# Patient Record
Sex: Female | Born: 1945
Health system: Southern US, Community
[De-identification: ages and names within clinical notes are randomized; demographics above are authoritative.]

## PROBLEM LIST (undated history)

## (undated) DIAGNOSIS — C50919 Malignant neoplasm of unspecified site of unspecified female breast: Secondary | ICD-10-CM

## (undated) DIAGNOSIS — I10 Essential (primary) hypertension: Secondary | ICD-10-CM

## (undated) DIAGNOSIS — Z923 Personal history of irradiation: Secondary | ICD-10-CM

## (undated) DIAGNOSIS — C801 Malignant (primary) neoplasm, unspecified: Secondary | ICD-10-CM

## (undated) DIAGNOSIS — F419 Anxiety disorder, unspecified: Secondary | ICD-10-CM

## (undated) HISTORY — DX: Essential (primary) hypertension: I10

## (undated) HISTORY — DX: Malignant (primary) neoplasm, unspecified: C80.1

## (undated) HISTORY — PX: TONSILLECTOMY AND ADENOIDECTOMY: SUR1326

## (undated) HISTORY — PX: MASTECTOMY: SHX3

## (undated) HISTORY — PX: COLONOSCOPY: SHX174

## (undated) HISTORY — DX: Anxiety disorder, unspecified: F41.9

---

## 1993-05-02 DIAGNOSIS — C50919 Malignant neoplasm of unspecified site of unspecified female breast: Secondary | ICD-10-CM

## 1993-05-02 HISTORY — PX: BREAST LUMPECTOMY: SHX2

## 1993-05-02 HISTORY — PX: OTHER SURGICAL HISTORY: SHX169

## 1993-05-02 HISTORY — DX: Malignant neoplasm of unspecified site of unspecified female breast: C50.919

## 1998-01-14 ENCOUNTER — Ambulatory Visit (HOSPITAL_COMMUNITY): Admission: RE | Admit: 1998-01-14 | Discharge: 1998-01-14 | Payer: Self-pay | Admitting: Gastroenterology

## 1998-05-07 ENCOUNTER — Ambulatory Visit (HOSPITAL_COMMUNITY): Admission: RE | Admit: 1998-05-07 | Discharge: 1998-05-07 | Payer: Self-pay | Admitting: Obstetrics and Gynecology

## 1999-05-18 ENCOUNTER — Other Ambulatory Visit: Admission: RE | Admit: 1999-05-18 | Discharge: 1999-05-18 | Payer: Self-pay | Admitting: Obstetrics and Gynecology

## 1999-07-08 ENCOUNTER — Encounter: Payer: Self-pay | Admitting: Obstetrics and Gynecology

## 1999-07-08 ENCOUNTER — Ambulatory Visit (HOSPITAL_COMMUNITY): Admission: RE | Admit: 1999-07-08 | Discharge: 1999-07-08 | Payer: Self-pay | Admitting: Obstetrics and Gynecology

## 2000-08-04 ENCOUNTER — Other Ambulatory Visit: Admission: RE | Admit: 2000-08-04 | Discharge: 2000-08-04 | Payer: Self-pay | Admitting: Obstetrics and Gynecology

## 2000-08-07 ENCOUNTER — Ambulatory Visit (HOSPITAL_COMMUNITY): Admission: RE | Admit: 2000-08-07 | Discharge: 2000-08-07 | Payer: Self-pay | Admitting: Obstetrics and Gynecology

## 2000-08-07 ENCOUNTER — Encounter: Payer: Self-pay | Admitting: Obstetrics and Gynecology

## 2001-10-10 ENCOUNTER — Ambulatory Visit (HOSPITAL_COMMUNITY): Admission: RE | Admit: 2001-10-10 | Discharge: 2001-10-10 | Payer: Self-pay | Admitting: Obstetrics and Gynecology

## 2001-10-10 ENCOUNTER — Encounter: Payer: Self-pay | Admitting: Obstetrics and Gynecology

## 2002-02-26 ENCOUNTER — Encounter: Admission: RE | Admit: 2002-02-26 | Discharge: 2002-02-26 | Payer: Self-pay | Admitting: Family Medicine

## 2002-02-26 ENCOUNTER — Encounter: Payer: Self-pay | Admitting: Family Medicine

## 2002-10-30 ENCOUNTER — Encounter: Payer: Self-pay | Admitting: General Surgery

## 2002-10-30 ENCOUNTER — Encounter: Admission: RE | Admit: 2002-10-30 | Discharge: 2002-10-30 | Payer: Self-pay | Admitting: General Surgery

## 2002-10-30 ENCOUNTER — Encounter (INDEPENDENT_AMBULATORY_CARE_PROVIDER_SITE_OTHER): Payer: Self-pay | Admitting: Specialist

## 2003-11-12 ENCOUNTER — Encounter: Admission: RE | Admit: 2003-11-12 | Discharge: 2003-11-12 | Payer: Self-pay | Admitting: Family Medicine

## 2004-01-29 ENCOUNTER — Ambulatory Visit (HOSPITAL_COMMUNITY): Admission: RE | Admit: 2004-01-29 | Discharge: 2004-01-29 | Payer: Self-pay | Admitting: Gastroenterology

## 2004-10-22 ENCOUNTER — Other Ambulatory Visit: Admission: RE | Admit: 2004-10-22 | Discharge: 2004-10-22 | Payer: Self-pay | Admitting: Obstetrics and Gynecology

## 2005-01-05 ENCOUNTER — Encounter: Admission: RE | Admit: 2005-01-05 | Discharge: 2005-01-05 | Payer: Self-pay | Admitting: General Surgery

## 2006-01-11 ENCOUNTER — Encounter: Admission: RE | Admit: 2006-01-11 | Discharge: 2006-01-11 | Payer: Self-pay | Admitting: Family Medicine

## 2007-01-17 ENCOUNTER — Encounter: Admission: RE | Admit: 2007-01-17 | Discharge: 2007-01-17 | Payer: Self-pay | Admitting: *Deleted

## 2008-03-18 ENCOUNTER — Encounter: Admission: RE | Admit: 2008-03-18 | Discharge: 2008-03-18 | Payer: Self-pay | Admitting: Family Medicine

## 2008-08-06 ENCOUNTER — Encounter: Payer: Self-pay | Admitting: Internal Medicine

## 2009-04-02 ENCOUNTER — Encounter: Admission: RE | Admit: 2009-04-02 | Discharge: 2009-04-02 | Payer: Self-pay | Admitting: Family Medicine

## 2009-11-17 ENCOUNTER — Ambulatory Visit: Payer: Self-pay | Admitting: Internal Medicine

## 2009-11-17 DIAGNOSIS — F411 Generalized anxiety disorder: Secondary | ICD-10-CM | POA: Insufficient documentation

## 2009-11-17 DIAGNOSIS — I10 Essential (primary) hypertension: Secondary | ICD-10-CM | POA: Insufficient documentation

## 2009-11-17 DIAGNOSIS — Z853 Personal history of malignant neoplasm of breast: Secondary | ICD-10-CM

## 2009-11-17 DIAGNOSIS — Z85828 Personal history of other malignant neoplasm of skin: Secondary | ICD-10-CM

## 2009-11-17 DIAGNOSIS — F17200 Nicotine dependence, unspecified, uncomplicated: Secondary | ICD-10-CM

## 2009-11-25 ENCOUNTER — Ambulatory Visit: Payer: Self-pay | Admitting: Internal Medicine

## 2009-11-26 LAB — CONVERTED CEMR LAB
ALT: 31 units/L (ref 0–35)
AST: 34 units/L (ref 0–37)
Albumin: 4.3 g/dL (ref 3.5–5.2)
Alkaline Phosphatase: 80 units/L (ref 39–117)
Basophils Relative: 0.3 % (ref 0.0–3.0)
Calcium: 8.7 mg/dL (ref 8.4–10.5)
Chloride: 99 meq/L (ref 96–112)
Cholesterol: 135 mg/dL (ref 0–200)
HCT: 43.1 % (ref 36.0–46.0)
Hemoglobin: 14.8 g/dL (ref 12.0–15.0)
LDL Cholesterol: 55 mg/dL (ref 0–99)
Lymphs Abs: 1.7 10*3/uL (ref 0.7–4.0)
MCV: 106.6 fL — ABNORMAL HIGH (ref 78.0–100.0)
Neutro Abs: 2.8 10*3/uL (ref 1.4–7.7)
Neutrophils Relative %: 51.5 % (ref 43.0–77.0)
Potassium: 5 meq/L (ref 3.5–5.1)
RDW: 13.4 % (ref 11.5–14.6)
Sodium: 137 meq/L (ref 135–145)
TSH: 0.59 microintl units/mL (ref 0.35–5.50)
Total CHOL/HDL Ratio: 2
Total Protein: 6.7 g/dL (ref 6.0–8.3)
Triglycerides: 10 mg/dL (ref 0.0–149.0)
VLDL: 2 mg/dL (ref 0.0–40.0)
WBC: 5.5 10*3/uL (ref 4.5–10.5)

## 2009-12-22 ENCOUNTER — Ambulatory Visit: Payer: Self-pay | Admitting: Internal Medicine

## 2009-12-25 ENCOUNTER — Telehealth (INDEPENDENT_AMBULATORY_CARE_PROVIDER_SITE_OTHER): Payer: Self-pay | Admitting: *Deleted

## 2010-02-09 ENCOUNTER — Telehealth: Payer: Self-pay | Admitting: Internal Medicine

## 2010-02-11 ENCOUNTER — Telehealth: Payer: Self-pay | Admitting: Internal Medicine

## 2010-02-12 ENCOUNTER — Telehealth: Payer: Self-pay | Admitting: Internal Medicine

## 2010-04-08 ENCOUNTER — Encounter
Admission: RE | Admit: 2010-04-08 | Discharge: 2010-04-08 | Payer: Self-pay | Source: Home / Self Care | Admitting: Internal Medicine

## 2010-04-08 LAB — HM MAMMOGRAPHY

## 2010-06-03 NOTE — Assessment & Plan Note (Signed)
Summary: NEW TO ESTABLISH AND CONCUSSION///PH///SPH   Vital Signs:  Patient profile:   65 year old female Height:      64.75 inches Weight:      119.6 pounds BMI:     20.13 Temp:     99.4 degrees F oral Pulse rate:   72 / minute Resp:     16 per minute BP sitting:   100 / 64  (left arm) Cuff size:   regular  Vitals Entered By: Shonna Chock CMA (November 17, 2009 1:19 PM)    History of Present Illness: Sherry Rice is here to establish as a  primary care patient; she is aymptomatic. BP well controlled by history, <120/80  on average.  The patient denies lightheadedness, urinary frequency, headaches, edema, rash, and fatigue.  The patient denies the following associated symptoms: chest pain, chest pressure, exercise intolerance, dyspnea, palpitations, syncope, leg edema, and pedal edema.  Compliance with medications (by patient report) has been near 100%.  The patient reports that dietary compliance has been good.    Preventive Screening-Counseling & Management  Alcohol-Tobacco     Smoking Status: current  Caffeine-Diet-Exercise     Does Patient Exercise: yes  Current Medications (verified): 1)  Benicar Hct 40-25 Mg Tabs (Olmesartan Medoxomil-Hctz) .... 1/2 By Mouth Once Daily 2)  Fluoxetine Hcl 20 Mg Caps (Fluoxetine Hcl) .... 2 By Mouth Once Daily  Allergies (verified): 1)  ! Codeine  Past History:  Past Medical History: Anxiety Breast cancer, hx of Hypertension Skin cancer, hx of, Basal Cell & Squamous Cell, Dr Emily Filbert  Past Surgical History: Lumpectomy & radiation 1995 Tonsillectomy Colonoscopy negative X 2 (last 2007), Dr Randa Evens  Family History: Father:HTN Mother: osteoarthritis, Breast Cancer, elevated chlosterol, ,HTN, MRSA in knee Siblings: 1 sister: HTN; PGM : colon cancer  Social History: Married Current Smoker: 1/2 ppd Alcohol use-yes:socially Regular exercise-yes:  daily as walking  & [employer] Smoking Status:  current Does Patient Exercise:  yes  Review  of Systems  The patient denies anorexia, fever, vision loss, decreased hearing, hoarseness, headaches, abdominal pain, melena, hematochezia, severe indigestion/heartburn, hematuria, incontinence, unusual weight change, abnormal bleeding, enlarged lymph nodes, and angioedema.         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 Resp:  Denies chest pain with inspiration, cough, coughing up blood, shortness of breath, sputum productive, and wheezing.  Physical Exam  General:  Thin but well-nourished; alert,appropriate and cooperative throughout examination Head:  Normocephalic and atraumatic without obvious abnormalities. Eyes:  No corneal or conjunctival inflammation noted.Perrla. Funduscopic exam benign, without hemorrhages, exudates or papilledema.  Ears:  External ear exam shows no significant lesions or deformities.  Otoscopic examination reveals clear canals, tympanic membranes are intact bilaterally without bulging, retraction, inflammation or discharge. Hearing is grossly normal bilaterally. Nose:  External nasal examination shows no deformity or inflammation. Nasal mucosa are pink and moist without lesions or exudates. Mouth:  Oral mucosa and oropharynx without lesions or exudates.  Teeth in good repair. Neck:  No deformities, masses, or tenderness noted. Physiologic asymmetry of thyroid Lungs:  Normal respiratory effort, chest expands symmetrically. Lungs are clear to auscultation, no crackles or wheezes. Heart:  Normal rate and regular rhythm. S1 and S2 normal without gallop, murmur, click, rub. Loud S4 vs mitral click  @ apex  Abdomen:  Bowel sounds positive,abdomen soft and non-tender without masses, organomegaly or hernias noted. Palpable aorta w/o AAA Genitalia:  to see Gyn Msk:  Slight symmetry of thoracic muscles ,R > L  Pulses:  R and L carotid,radial,dorsalis pedis and posterior tibial pulses are full and equal bilaterally Extremities:  No clubbing, cyanosis, edema, or  deformity noted with normal full range of motion of all joints.   Neurologic:  alert & oriented X3 and DTRs symmetrical and normal.   Skin:  Biopsy site L cheek bandaged Cervical Nodes:  No lymphadenopathy noted Axillary Nodes:  No palpable lymphadenopathy Psych:  memory intact for recent and remote, normally interactive, good eye contact, not anxious appearing, and not depressed appearing.     Impression & Recommendations:  Problem # 1:  ROUTINE GENERAL MEDICAL EXAM@HEALTH  CARE FACL (ICD-V70.0)  Orders: EKG w/ Interpretation (93000) T-2 View CXR (71020TC)  Problem # 2:  HYPERTENSION (ICD-401.9)  controlled Her updated medication list for this problem includes:    Benicar Hct 40-25 Mg Tabs (Olmesartan medoxomil-hctz) .Marland Kitchen... 1/2 by mouth once daily  Orders: EKG w/ Interpretation (93000)  Problem # 3:  BREAST CANCER, HX OF (ICD-V10.3)  Problem # 4:  SMOKER (ICD-305.1)  CV risks discussed  Orders: T-2 View CXR (71020TC)  Problem # 5:  SKIN CANCER, HX OF (ICD-V10.83) as per Dr Emily Filbert  Problem # 6:  ANXIETY (ICD-300.00) stable on Fluoxetine Her updated medication list for this problem includes:    Fluoxetine Hcl 20 Mg Caps (Fluoxetine hcl) .Marland Kitchen... 2 by mouth once daily  Complete Medication List: 1)  Benicar Hct 40-25 Mg Tabs (Olmesartan medoxomil-hctz) .... 1/2 by mouth once daily 2)  Fluoxetine Hcl 20 Mg Caps (Fluoxetine hcl) .... 2 by mouth once daily  Patient Instructions: 1)  Please consider fasting labs: 2)  BMP ; 3)  Hepatic Panel ; 4)  Lipid Panel ; 5)  TSH ; 6)  CBC w/ Diff . 7)  Check your Blood Pressure regularly. If it is above: 135/85 ON AVERAGE  you should make an appointment. Prescriptions: FLUOXETINE HCL 20 MG CAPS (FLUOXETINE HCL) 2 by mouth once daily  #180 x 1   Entered and Authorized by:   Marga Melnick MD   Signed by:   Marga Melnick MD on 11/17/2009   Method used:   Print then Give to Patient   RxID:   [id number] BENICAR HCT 40-25 MG  TABS (OLMESARTAN MEDOXOMIL-HCTZ) 1/2 by mouth once daily  #30 x 5   Entered and Authorized by:   Marga Melnick MD   Signed by:   Marga Melnick MD on 11/17/2009   Method used:   Print then Give to Patient   RxID:   [id number]

## 2010-06-03 NOTE — Progress Notes (Signed)
Summary: BP med alternative  Phone Note Call from Patient Call back at Work Phone [phone]   Summary of Call: Patient left message on triage that she was given Losartan and when she went to pick it up the prescription cost $200+. Patient would like cheaper alternative. Do you have an alternative that you prefer to give or should the patient contact her insurance company for preferred alternative. Please advise. Initial call taken by: Lucious Groves CMA,  February 11, 2010 10:25 AM  Follow-up for Phone Call        Per MD-- This is a generic and should be the cheapest in class. Please check with your insurance company.  I spoke with patient pharmacy and the med is $227.89 b/c she does not have insurance. I notified the patient and she has an Inclusive plan that 5k was put into, so she will take her card up to the pharmacy so the prescription can be ran again with insurance. Lucious Groves CMA  February 11, 2010 11:16 AM

## 2010-06-03 NOTE — Progress Notes (Signed)
Summary: Prior Auth--Losartan  Phone Note Refill Request Message from:  Pharmacy on February 12, 2010 7:57 AM  Refills Requested: Medication #1:  LOSARTAN POTASSIUM-HCTZ 100-25 MG TABS 1 by mouth qd.   Dosage confirmed as above?Dosage Confirmed   Supply Requested: 1 month Prior Auth from [facility] on [address]  Please call insurance per pharmacy.  Initial call taken by: Harold Barban,  February 12, 2010 7:57 AM  Follow-up for Phone Call        Called the # on patient card [phone] and was directed to call [phone]. Meds are being blocked due to pre-existing cond. and the member must call her ins co at [phone].  Patient notified and will take care of it. Follow-up by: Lucious Groves CMA,  February 15, 2010 9:14 AM

## 2010-06-03 NOTE — Progress Notes (Signed)
Summary: Benicar--change to generic   Phone Note Call from Patient Call back at Work Phone [phone] Message from:  Patient on February 09, 2010 11:33 AM  Caller: Patient Summary of Call: patient ins wont pay for benicar hct - patient wants  a gereric that is comparable to  benicar - [facility] Initial call taken by: Okey Regal [place],  February 09, 2010 11:35 AM  Follow-up for Phone Call        Please advise. Lucious Groves CMA  February 09, 2010 1:25 PM   Additional Follow-up for Phone Call Additional follow up Details #1::        Losartan HCT  100/ 25 #90 Additional Follow-up by: Marga Melnick MD,  February 09, 2010 3:24 PM    Additional Follow-up for Phone Call Additional follow up Details #2::    left message on voicemail notifying the patient that prescription was taken care of, call with questions. Lucious Groves CMA  February 09, 2010 5:03 PM   New/Updated Medications: LOSARTAN POTASSIUM-HCTZ 100-25 MG TABS (LOSARTAN POTASSIUM-HCTZ) 1 by mouth qd Prescriptions: LOSARTAN POTASSIUM-HCTZ 100-25 MG TABS (LOSARTAN POTASSIUM-HCTZ) 1 by mouth qd  #90 x 0   Entered by:   Lucious Groves CMA   Authorized by:   Marga Melnick MD   Signed by:   Lucious Groves CMA on 02/09/2010   Method used:   Electronically to        [id number] (retail)       [address]       [place], [place]  [address]       Ph: [phone]       Fax: [phone]   RxID:   [id number]

## 2010-06-03 NOTE — Progress Notes (Signed)
Summary: -refill request  Phone Note Refill Request Call back at [phone] Message from:  Pharmacy on December 25, 2009 4:13 PM  Refills Requested: Medication #1:  BENICAR HCT 40-25 MG TABS 1/2 by mouth once daily   Dosage confirmed as above?Dosage Confirmed   Supply Requested: 1 month prior auth: [id number]   Next Appointment Scheduled: none Initial call taken by: Lavell Islam,  December 25, 2009 4:13 PM  Follow-up for Phone Call        spoke with insurance co. they state that pt med are being denied due preexisting condition clause which is denying any med pt was on prior to enrolling with this policy. per insurance it will not expire  until  oct 31,2011. pt need to contact insurance to have clause removed. left message to call office...................Marland KitchenFelecia Deloach CMA  December 25, 2009 4:56 PM  PT AWARE.........Marland KitchenFelecia Deloach CMA  December 25, 2009 5:17 PM

## 2010-06-24 ENCOUNTER — Telehealth: Payer: Self-pay | Admitting: Internal Medicine

## 2010-06-29 NOTE — Progress Notes (Signed)
Summary: Med refill  Phone Note Refill Request   Refills Requested: Medication #1:  FLUOXETINE HCL 20 MG CAPS 2 by mouth once daily The office received refill about the above med from pharmacy in [place]. Refill was provided via phone with notation that office visit is due in July.  Initial call taken by: Lucious Groves CMA,  June 24, 2010 9:44 AM    Prescriptions: FLUOXETINE HCL 20 MG CAPS (FLUOXETINE HCL) 2 by mouth once daily  #180 x 1   Entered by:   Lucious Groves CMA   Authorized by:   Marga Melnick MD   Signed by:   Lucious Groves CMA on 06/24/2010   Method used:   Telephoned to ...       [facility] [id number]* (retail)       [address]       [place], [place]  [address]       Ph: [phone]       Fax: [phone]   RxID:   [id number]

## 2010-08-09 ENCOUNTER — Other Ambulatory Visit: Payer: Self-pay | Admitting: *Deleted

## 2010-08-09 MED ORDER — LOSARTAN POTASSIUM-HCTZ 100-25 MG PO TABS: 1.0000 | ORAL_TABLET | Freq: Every day | ORAL | Status: DC

## 2010-09-17 NOTE — Op Note (Signed)
NAME:  Sherry Rice, Sherry Rice               ACCOUNT NO.:  [id number]   MEDICAL RECORD NO.:  [id number]          PATIENT TYPE:  AMB   LOCATION:  ENDO                         FACILITY:  [facility]   PHYSICIAN:  James L. Malon Kindle., M.D.DATE OF BIRTH:  29-Mar-1946   DATE OF PROCEDURE:  DATE OF DISCHARGE:                                 OPERATIVE REPORT   PROCEDURE:  Colonoscopy.   MEDICATIONS:  Fentanyl 87.5 mcg, Versed 9 mg IV.   INSTRUMENT USED:  Olympus pediatric colonoscope.   INDICATIONS FOR PROCEDURE:  Family history of colon cancer.   DESCRIPTION OF PROCEDURE:  After the procedure had been explained to the  patient, consent was obtained.  In the left lateral decubitus position, the  scope was inserted and advanced.  The prep was quite good.  The scope was  readily advanced to the cecum.  The ileocecal valve and appendiceal orifice  were seen.  The scope was withdrawn.  The cecum, ascending colon, transverse  colon, descending, and sigmoid colon were seen well.  No polyps were seen.  There were internal hemorrhoids seen in the rectum.  The scope was  withdrawn.  The patient tolerated the procedure well.   ASSESSMENT:  1.  Family history of colon cancer with negative colonoscopy (B16.0).  2.  Internal hemorrhoids (455.0).   PLAN:  Will recommend Metamucil, yearly Hemoccults, and repeat colonoscopy  in about five years.      JLE/MEDQ  D:  01/29/2004  T:  01/29/2004  Job:  [id number]   cc:   Dellis Anes. Idell Pickles, M.D.  [address]  [place]  [place] [address]  Fax: [phone]

## 2010-11-08 ENCOUNTER — Other Ambulatory Visit: Payer: Self-pay | Admitting: Internal Medicine

## 2010-12-24 ENCOUNTER — Other Ambulatory Visit: Payer: Self-pay | Admitting: Internal Medicine

## 2011-01-04 ENCOUNTER — Encounter: Payer: Self-pay | Admitting: Internal Medicine

## 2011-01-05 ENCOUNTER — Ambulatory Visit (INDEPENDENT_AMBULATORY_CARE_PROVIDER_SITE_OTHER): Payer: PRIVATE HEALTH INSURANCE | Admitting: Internal Medicine

## 2011-01-05 ENCOUNTER — Encounter: Payer: Self-pay | Admitting: Internal Medicine

## 2011-01-05 VITALS — BP 118/80 | HR 86 | Temp 98.6°F | Resp 12 | Ht 64.75 in | Wt 125.6 lb

## 2011-01-05 DIAGNOSIS — R42 Dizziness and giddiness: Secondary | ICD-10-CM

## 2011-01-05 DIAGNOSIS — Z853 Personal history of malignant neoplasm of breast: Secondary | ICD-10-CM

## 2011-01-05 DIAGNOSIS — Z85828 Personal history of other malignant neoplasm of skin: Secondary | ICD-10-CM

## 2011-01-05 DIAGNOSIS — Z0000 Encounter for general adult medical examination without abnormal findings: Secondary | ICD-10-CM

## 2011-01-05 DIAGNOSIS — I10 Essential (primary) hypertension: Secondary | ICD-10-CM

## 2011-01-05 LAB — POCT URINALYSIS DIPSTICK
Glucose, UA: NEGATIVE
Nitrite, UA: NEGATIVE
Protein, UA: NEGATIVE
Spec Grav, UA: 1.01
pH, UA: 6

## 2011-01-05 MED ORDER — LOSARTAN POTASSIUM-HCTZ 100-25 MG PO TABS: 0.5000 | ORAL_TABLET | ORAL | Status: DC

## 2011-01-05 NOTE — Progress Notes (Signed)
Subjective:    Patient ID: Sherry Rice, female    DOB: August 30, 1945, 65 y.o.   MRN: [id number]  HPI  Sherry Rice  is here for a physical;acute issues intermittent dizziness. She had a concussion in May of 2011; she had dizziness intermittently for 6 months thereafter. The concussion was evaluated in [place] with a CAT scan.      Review of Systems Dizziness: Onset:see above; exacerbation in past 3 weeks w/o trigger Context: Position change:yes, especially arising from bed @ night; she had one episode this weekend while standing and cheering ballgame. She's had single episode while reclining for hair treatment. Benign positional vertigo symptoms:occasionally Straining:no Pain:no Cardiac prodrome: no palpitations, irregular rhythm, heart rate change Neurologic prodrome: no  headache, numbness and tingling, weakness, change in coordination (gait/falling) Syncope:no Seizure activity:no Upper respiratory tract infection/extrinsic symptoms:no Duration:lasts 1 minute Frequency:daily Associated signs and symptoms: Visual change (blurred/double/loss):no Hearing loss/tinnitus:no Nausea/sweating:no Chest pain:no Treatment/response:none    Patient reports no vision/ hearing  changes, adenopathy,fever, weight change,  persistant / recurrent hoarseness , swallowing issues,edema, hemoptysis, dyspnea( rest/ exertional/paroxysmal nocturnal), gastrointestinal bleeding(melena, rectal bleeding), abdominal pain, significant heartburn,  bowel changes,GU symptoms(dysuria, hematuria,pyuria ), Gyn symptoms(abnormal  bleeding , pain),   memory loss,  skin/hair /nail changes,abnormal bruising or bleeding, anxiety,or depression.      Objective:   Physical Exam Gen.:thin but  well-nourished in appearance. Alert, appropriate and cooperative throughout exam. Head: Normocephalic without obvious abnormalities Eyes: No corneal or conjunctival inflammation noted. Pupils equal round reactive to light and accommodation.  Fundal exam is benign without hemorrhages, exudate, papilledema. Extraocular motion intact. Vision grossly normal with lenses. FOV WNL. No nystagmus Ears: External  ear exam reveals no significant lesions or deformities. Canals: wax on R . L TM normal. Hearing is grossly normal bilaterally. Tuning fork exam normal Nose: External nasal exam reveals no deformity or inflammation. Nasal mucosa are pink and moist. No lesions or exudates noted.  Mouth: Oral mucosa and oropharynx reveal no lesions or exudates. Teeth in good repair. Neck: No deformities, masses, or tenderness noted. Range of motion & . Thyroid normal. Cervical rib suggested on L Lungs: Normal respiratory effort; chest expands symmetrically. Lungs are clear to auscultation without rales, wheezes, or increased work of breathing. Heart:S4,  slightly irregular rhythm due extra beats. No murmur. Abdomen: Bowel sounds normal; abdomen soft and nontender. No masses, organomegaly or hernias noted.aorta palpable Genitalia: seeing Gyn   .                                                                                   Musculoskeletal/extremities: No deformity or scoliosis noted of  the thoracic or lumbar spine. No clubbing, cyanosis, edema noted. Range of motion  normal .Tone & strength  Normal.Joints: DIP OA changes. Nail health  good. Vascular: Carotid, radial artery, dorsalis pedis and  posterior tibial pulses are full and equal. No bruits present. Neurologic: Alert and oriented x3. Deep tendon reflexes symmetrical and normal. Cranial nerve exam is normal. Gait is normal as well. Romberg testing and finger to nose exam was also negative.          Skin: Intact without suspicious lesions or rashes. Lymph: No cervical,  axillary  lymphadenopathy present. Psych: Mood and affect are normal. Normally interactive                                                                                        Assessment & Plan:  #1 comprehensive physical exam;  no acute findings #2 see Problem List with Assessments & Recommendations  #3 dizziness; the main process appears to be postural hypotension with episodes upon arising at night. She's had isolated episodes of possible benign positional vertigo and possibly vertebrobasilar insufficiency.  #4 frequent PVCs on EKG with intermittent bigeminy. I doubt a cardiac dysrhythmia as  etiology of her dizziness.  #5 current smoker; cardiac and pulmonary risks discussed. Plan: see Orders

## 2011-01-05 NOTE — Patient Instructions (Addendum)
Repeat the isometric exercises discussed 4- 5 times prior to standing if you've been seated or lying  for a period of time. Go to Web M.D. for information on benign positional vertigo (BPV) . Physical therapy exercises can treat that. As stated he may have a component of vertebrobasilar insufficiency; the episode the pancreas to suggest that. The major problem appears to be  Positional  Hypotension. Please think about quitting smoking.  This is very important for your health.  Consider setting a quit date, then cutting back or switching brands to prepare to stop.  Also think of the money you will save every day by not smoking. Please think about quitting smoking. Review the risks we discussed. Please call 1-800-QUIT-NOW (802) 052-1344) for free smoking cessation counseling Blood Pressure Goal  Ideally is an AVERAGE < 135/85. This AVERAGE should be calculated from @ least 5-7 BP readings taken @ different times of day on different days of week. You should not respond to isolated BP readings , but rather the AVERAGE for that week Monitor BP on 1/2 of Losartan /HCT. To prevent  Premature ventricular  beats, avoid stimulants such as decongestants, diet pills, nicotine, or caffeine (coffee, tea, cola, or chocolate) to excess.  Please  schedule fasting Labs : BMET,Lipids, hepatic panel, CBC & dif, TSH (V70.0)

## 2011-01-06 ENCOUNTER — Other Ambulatory Visit (INDEPENDENT_AMBULATORY_CARE_PROVIDER_SITE_OTHER): Payer: PRIVATE HEALTH INSURANCE

## 2011-01-06 DIAGNOSIS — Z0000 Encounter for general adult medical examination without abnormal findings: Secondary | ICD-10-CM

## 2011-01-06 LAB — LIPID PANEL
Cholesterol: 123 mg/dL (ref 0–200)
LDL Cholesterol: 39 mg/dL (ref 0–99)
Triglycerides: 37 mg/dL (ref 0.0–149.0)

## 2011-01-06 LAB — CBC WITH DIFFERENTIAL/PLATELET
Basophils Absolute: 0 10*3/uL (ref 0.0–0.1)
HCT: 43 % (ref 36.0–46.0)
Hemoglobin: 14.5 g/dL (ref 12.0–15.0)
Lymphocytes Relative: 42.3 % (ref 12.0–46.0)
MCHC: 33.7 g/dL (ref 30.0–36.0)
Platelets: 199 10*3/uL (ref 150.0–400.0)

## 2011-01-06 LAB — BASIC METABOLIC PANEL
Chloride: 102 mEq/L (ref 96–112)
GFR: 123.8 mL/min (ref >60.00)
Potassium: 4.4 mEq/L (ref 3.5–5.1)
Sodium: 141 mEq/L (ref 135–145)

## 2011-01-06 LAB — HEPATIC FUNCTION PANEL
AST: 41 U/L — ABNORMAL HIGH (ref 0–37)
Albumin: 4.4 g/dL (ref 3.5–5.2)
Alkaline Phosphatase: 59 U/L (ref 39–117)
Bilirubin, Direct: 0.2 mg/dL (ref 0.0–0.3)
Total Bilirubin: 0.8 mg/dL (ref 0.3–1.2)

## 2011-01-06 NOTE — Progress Notes (Signed)
Labs only

## 2011-01-10 ENCOUNTER — Other Ambulatory Visit: Payer: Self-pay | Admitting: Internal Medicine

## 2011-03-22 ENCOUNTER — Other Ambulatory Visit: Payer: Self-pay | Admitting: Internal Medicine

## 2011-03-30 ENCOUNTER — Other Ambulatory Visit: Payer: Self-pay | Admitting: Internal Medicine

## 2011-03-30 DIAGNOSIS — Z853 Personal history of malignant neoplasm of breast: Secondary | ICD-10-CM

## 2011-03-30 DIAGNOSIS — Z1231 Encounter for screening mammogram for malignant neoplasm of breast: Secondary | ICD-10-CM

## 2011-04-11 ENCOUNTER — Ambulatory Visit
Admission: RE | Admit: 2011-04-11 | Discharge: 2011-04-11 | Disposition: A | Discharge status: Home / Self Care | Payer: PRIVATE HEALTH INSURANCE | Source: Ambulatory Visit | Attending: Internal Medicine | Admitting: Internal Medicine

## 2011-04-11 DIAGNOSIS — Z1231 Encounter for screening mammogram for malignant neoplasm of breast: Secondary | ICD-10-CM

## 2011-04-11 DIAGNOSIS — Z853 Personal history of malignant neoplasm of breast: Secondary | ICD-10-CM

## 2011-04-13 ENCOUNTER — Other Ambulatory Visit: Payer: Self-pay | Admitting: Internal Medicine

## 2011-04-13 DIAGNOSIS — R928 Other abnormal and inconclusive findings on diagnostic imaging of breast: Secondary | ICD-10-CM

## 2011-04-15 ENCOUNTER — Ambulatory Visit
Admission: RE | Admit: 2011-04-15 | Discharge: 2011-04-15 | Disposition: A | Discharge status: Home / Self Care | Payer: PRIVATE HEALTH INSURANCE | Source: Ambulatory Visit | Attending: Internal Medicine | Admitting: Internal Medicine

## 2011-04-15 DIAGNOSIS — R928 Other abnormal and inconclusive findings on diagnostic imaging of breast: Secondary | ICD-10-CM

## 2011-07-06 ENCOUNTER — Other Ambulatory Visit: Payer: Self-pay | Admitting: Internal Medicine

## 2011-07-06 NOTE — Telephone Encounter (Signed)
Prescription sent to pharmacy.

## 2012-01-03 ENCOUNTER — Other Ambulatory Visit: Payer: Self-pay | Admitting: Internal Medicine

## 2012-01-03 NOTE — Telephone Encounter (Signed)
Patient needs to schedule a CPX  

## 2012-03-13 ENCOUNTER — Other Ambulatory Visit: Payer: Self-pay | Admitting: Internal Medicine

## 2012-03-13 DIAGNOSIS — Z853 Personal history of malignant neoplasm of breast: Secondary | ICD-10-CM

## 2012-03-13 DIAGNOSIS — Z1231 Encounter for screening mammogram for malignant neoplasm of breast: Secondary | ICD-10-CM

## 2012-04-11 ENCOUNTER — Ambulatory Visit
Admission: RE | Admit: 2012-04-11 | Discharge: 2012-04-11 | Disposition: A | Discharge status: Home / Self Care | Payer: PRIVATE HEALTH INSURANCE | Source: Ambulatory Visit | Attending: Internal Medicine | Admitting: Internal Medicine

## 2012-04-11 DIAGNOSIS — Z853 Personal history of malignant neoplasm of breast: Secondary | ICD-10-CM

## 2012-04-11 DIAGNOSIS — Z1231 Encounter for screening mammogram for malignant neoplasm of breast: Secondary | ICD-10-CM

## 2012-04-12 ENCOUNTER — Encounter: Payer: Self-pay | Admitting: Internal Medicine

## 2012-04-12 ENCOUNTER — Ambulatory Visit (INDEPENDENT_AMBULATORY_CARE_PROVIDER_SITE_OTHER): Payer: PRIVATE HEALTH INSURANCE | Admitting: Internal Medicine

## 2012-04-12 VITALS — BP 112/70 | HR 76 | Temp 98.6°F | Resp 12 | Ht 64.08 in | Wt 126.6 lb

## 2012-04-12 DIAGNOSIS — H8110 Benign paroxysmal vertigo, unspecified ear: Secondary | ICD-10-CM

## 2012-04-12 DIAGNOSIS — R9431 Abnormal electrocardiogram [ECG] [EKG]: Secondary | ICD-10-CM

## 2012-04-12 DIAGNOSIS — I10 Essential (primary) hypertension: Secondary | ICD-10-CM

## 2012-04-12 DIAGNOSIS — Z0000 Encounter for general adult medical examination without abnormal findings: Secondary | ICD-10-CM

## 2012-04-12 LAB — HEPATIC FUNCTION PANEL
AST: 26 U/L (ref 0–37)
Albumin: 4.7 g/dL (ref 3.5–5.2)
Alkaline Phosphatase: 67 U/L (ref 39–117)
Bilirubin, Direct: 0.2 mg/dL (ref 0.0–0.3)

## 2012-04-12 LAB — BASIC METABOLIC PANEL
CO2: 28 mEq/L (ref 19–32)
GFR: 113.37 mL/min (ref >60.00)
Glucose, Bld: 100 mg/dL — ABNORMAL HIGH (ref 70–99)
Potassium: 3.7 mEq/L (ref 3.5–5.1)
Sodium: 134 mEq/L — ABNORMAL LOW (ref 135–145)

## 2012-04-12 LAB — CBC WITH DIFFERENTIAL/PLATELET
Basophils Absolute: 0 10*3/uL (ref 0.0–0.1)
Eosinophils Relative: 2 % (ref 0.0–5.0)
Monocytes Absolute: 0.6 10*3/uL (ref 0.1–1.0)
Monocytes Relative: 8.5 % (ref 3.0–12.0)
Neutrophils Relative %: 43.8 % (ref 43.0–77.0)
Platelets: 230 10*3/uL (ref 150.0–400.0)
WBC: 6.9 10*3/uL (ref 4.5–10.5)

## 2012-04-12 LAB — LIPID PANEL
LDL Cholesterol: 41 mg/dL (ref 0–99)
Total CHOL/HDL Ratio: 2
VLDL: 5 mg/dL (ref 0.0–40.0)

## 2012-04-12 LAB — TSH: TSH: 0.48 u[IU]/mL (ref 0.35–5.50)

## 2012-04-12 MED ORDER — LOSARTAN POTASSIUM-HCTZ 100-25 MG PO TABS: 1.0000 | ORAL_TABLET | ORAL | Status: DC

## 2012-04-12 NOTE — Patient Instructions (Addendum)
Preventive Health Care: Exercise  30-45  minutes a day, 3-4 days a week. Walking is especially valuable in preventing Osteoporosis. Eat a low-fat diet with lots of fruits and vegetables, up to 7-9 servings per day.  Consume less than 30 grams of sugar per day from foods & drinks with High Fructose Corn Syrup as #1,2,3 or #4 on label. Blood Pressure Goal  Ideally is an AVERAGE < 135/85. This AVERAGE should be calculated from @ least 5-7 BP readings taken @ different times of day on different days of week. You should not respond to isolated BP readings , but rather the AVERAGE for that week . Take the EKG to any emergency room or preop visits. There are nonspecific changes; as long as there is no new change these are not clinically significant Please think about quitting smoking. Review the risks we discussed. Please call 1-800-QUIT-NOW (458-102-9492) for free smoking cessation counseling.  Health Care Power of Attorney & Living Will place you in charge of your health care  decisions. Verify these are  in place. Go to Web M.D. for information on benign positional vertigo (BPV) . Physical therapy exercises can treat that.  If you activate My Chart; the results can be released to you as soon as they populate from the lab. If you choose not to use this program; the labs have to be reviewed, copied & mailed   causing a delay in getting the results to you.

## 2012-04-12 NOTE — Progress Notes (Signed)
  Subjective:    Patient ID: Sherry Rice, female    DOB: [birth year], 66 y.o.   MRN: [id number]  HPI  [name]  is here for a physical;acute issues include intermittent BPV      Review of Systems The benign positional vertigo flared approximately 2 weeks ago without trigger. It occurs in the right lateral decubitus position or when she tries to sit up in bed. She's never seen physical therapy; but her son-in-law is a physical therapist.     Objective:   Physical Exam Gen.: Thin but healthy and well-nourished in appearance. Alert, appropriate and cooperative throughout exam. Head: Normocephalic without obvious abnormalities Eyes: No corneal or conjunctival inflammation noted. Pupils equal round reactive to light and accommodation. Fundal exam is benign without hemorrhages, exudate, papilledema. Extraocular motion intact. Vision grossly normal with lenses. She has minimal, insignificant nystagmus with lateral gaze, questionably slightly more to the right Ears: External  ear exam reveals no significant lesions or deformities. Canals clear .TMs normal. Hearing is grossly normal bilaterally. The tuning fork exam reveals air conduction greater than bone conduction bilaterally. She states that she does not feel vibrations in the left ear with midline placement of the tuning fork Nose: External nasal exam reveals no deformity or inflammation. Nasal mucosa are pink and moist. No lesions or exudates noted.    Mouth: Oral mucosa and oropharynx reveal no lesions or exudates. Teeth in good repair. Neck: No deformities, masses, or tenderness noted. Range of motion & Thyroid normal Lungs: Normal respiratory effort; chest expands symmetrically. Lungs are clear to auscultation without rales, wheezes, or increased work of breathing. Heart: Normal rate and rhythm. Normal S1 and S2. No gallop, click, or rub. Grade 1/2 over 6 systolic murmur  @ R base Abdomen: Bowel sounds normal; abdomen soft and nontender. No  masses, organomegaly or hernias noted. Genitalia: Dr Annamarie Dawley  Gyn practice Musculoskeletal/extremities: There is some asymmetry of the posterior thoracic musculature suggesting occult scoliosis. . No clubbing, cyanosis, edema noted. Range of motion  normal .Tone & strength  normal.DIP osteoarthritic finger changes . Nail health  good. Vascular: Carotid, radial artery, dorsalis pedis and  posterior tibial pulses are full and equal. No bruits present. Neurologic: Alert and oriented x3. Deep tendon reflexes symmetrical and normal. Finger to nose testing and Romberg testing normal         Skin: Intact without suspicious lesions or rashes. Lymph: No cervical, axillary lymphadenopathy present. Psych: Mood and affect are normal. Normally interactive                                                                                        Assessment & Plan:  #1 comprehensive physical exam; no acute findings #2 subjective intermittent benign positional vertigo in the context of post concussion. Abnormal tuning fork exam. Plan: see Orders

## 2012-04-13 ENCOUNTER — Ambulatory Visit: Payer: PRIVATE HEALTH INSURANCE

## 2012-04-13 DIAGNOSIS — R7309 Other abnormal glucose: Secondary | ICD-10-CM

## 2012-04-13 LAB — HEMOGLOBIN A1C: Hgb A1c MFr Bld: 6 % (ref 4.6–6.5)

## 2012-04-18 ENCOUNTER — Other Ambulatory Visit: Payer: Self-pay | Admitting: Internal Medicine

## 2012-04-18 DIAGNOSIS — R928 Other abnormal and inconclusive findings on diagnostic imaging of breast: Secondary | ICD-10-CM

## 2012-04-19 ENCOUNTER — Ambulatory Visit
Admission: RE | Admit: 2012-04-19 | Discharge: 2012-04-19 | Disposition: A | Discharge status: Home / Self Care | Payer: PRIVATE HEALTH INSURANCE | Source: Ambulatory Visit | Attending: Internal Medicine | Admitting: Internal Medicine

## 2012-04-19 DIAGNOSIS — R928 Other abnormal and inconclusive findings on diagnostic imaging of breast: Secondary | ICD-10-CM

## 2012-09-11 ENCOUNTER — Ambulatory Visit (HOSPITAL_BASED_OUTPATIENT_CLINIC_OR_DEPARTMENT_OTHER)
Admission: RE | Admit: 2012-09-11 | Discharge: 2012-09-11 | Disposition: A | Discharge status: Home / Self Care | Payer: BC Managed Care – PPO | Source: Ambulatory Visit | Attending: Internal Medicine | Admitting: Internal Medicine

## 2012-09-11 ENCOUNTER — Encounter: Payer: Self-pay | Admitting: Internal Medicine

## 2012-09-11 ENCOUNTER — Ambulatory Visit (INDEPENDENT_AMBULATORY_CARE_PROVIDER_SITE_OTHER): Payer: BC Managed Care – PPO | Admitting: Internal Medicine

## 2012-09-11 VITALS — BP 118/80 | HR 71 | Temp 98.2°F | Wt 130.0 lb

## 2012-09-11 DIAGNOSIS — F17200 Nicotine dependence, unspecified, uncomplicated: Secondary | ICD-10-CM

## 2012-09-11 DIAGNOSIS — J069 Acute upper respiratory infection, unspecified: Secondary | ICD-10-CM

## 2012-09-11 DIAGNOSIS — J209 Acute bronchitis, unspecified: Secondary | ICD-10-CM

## 2012-09-11 DIAGNOSIS — R05 Cough: Secondary | ICD-10-CM | POA: Insufficient documentation

## 2012-09-11 DIAGNOSIS — J3489 Other specified disorders of nose and nasal sinuses: Secondary | ICD-10-CM | POA: Insufficient documentation

## 2012-09-11 DIAGNOSIS — R059 Cough, unspecified: Secondary | ICD-10-CM | POA: Insufficient documentation

## 2012-09-11 MED ORDER — AZITHROMYCIN 250 MG PO TABS: ORAL_TABLET | ORAL | Status: DC

## 2012-09-11 MED ORDER — PREDNISONE 20 MG PO TABS: 20.0000 mg | ORAL_TABLET | Freq: Two times a day (BID) | ORAL | Status: DC

## 2012-09-11 MED ORDER — BUDESONIDE-FORMOTEROL FUMARATE 160-4.5 MCG/ACT IN AERO: 2.0000 | INHALATION_SPRAY | Freq: Two times a day (BID) | RESPIRATORY_TRACT | Status: DC

## 2012-09-11 NOTE — Progress Notes (Signed)
  Subjective:    Patient ID: Sherry Rice, female    DOB: [birth year], 67 y.o.   MRN: [id number]  HPI   Symptoms began approximately 13 days ago in [place] as head congestion which has persisted and is associated with copious clear nasal secretions. She initially had a sore throat which did not persist. She's had pressure discomfort in her eyes. She's had minor extrinsic eye & nasal symptoms initially. She also describes minor discomfort in the frontal and maxillary sinuses. She describes diffuse myalgias and arthralgias  "as if I had the flu"  Approximately a week ago she developed a rattly but nonproductive cough.  She has used Sudafed, over-the-counter nasal spray, Mucinex DM with only partial relief.  She has no history of asthma. She smokes three fourths pack per day    Review of Systems  She denies otic pain, otic discharge , or dental pain. There've been no purulent secretions from the head or chest.  No fever , chills or sweats    Objective:   Physical Exam  General appearance:good health ;well nourished; no acute distress or increased work of breathing is present.  No  lymphadenopathy about the head, neck, or axilla noted.   Eyes: No conjunctival inflammation or lid edema is present. Extraocular motion is intact. Vision is normal with lenses.  Ears:  External ear exam shows no significant lesions or deformities.  Otoscopic examination reveals clear canals, tympanic membranes are intact bilaterally without bulging, retraction, inflammation or discharge.  Nose:  External nasal examination shows no deformity or inflammation. Nasal mucosa are dry without lesions or exudates. No septal dislocation or deviation.No obstruction to airflow.   Oral exam: Dental hygiene is good; lips and gums are healthy appearing.There is no oropharyngeal erythema or exudate noted. Slightly hoarse  Neck:  No deformities, masses, or tenderness noted.     Heart:  Normal rate and regular rhythm. S1 and S2  normal without gallop, murmur, click, rub or other extra sounds.   Lungs: She exhibits coarse rhonchi and musical breath sounds which are asymmetric, greater in the right lung and right upper lobe than on the left. No increased work of breathing.  Brassy nonproductive cough.  Extremities:  No cyanosis, edema, or clubbing  noted    Skin: Warm & dry .         Assessment & Plan:  #1 acute bronchitis with asymmetric bronchospasm #2 URI, acute #3 smoker #4 low O2 sats Plan: See orders and recommendations

## 2012-09-11 NOTE — Patient Instructions (Addendum)
Plain Mucinex (NOT D) for thick secretions ;force NON dairy fluids .   Nasal cleansing in the shower as discussed with lather of mild shampoo.After 10 seconds wash off lather while  exhaling through nostrils. Make sure that all residual soap is removed to prevent irritation.  Use a Neti pot daily only  as needed for significant sinus congestion; going from open side to congested side . Plain Allegra (NOT D )  160 daily , Loratidine 10 mg , OR Zyrtec 10 mg @ bedtime  as needed for itchy eyes & sneezing. Order for x-rays entered into  the computer; these will be performed at [facility]. No appointment is necessary.   Please think about quitting smoking. Review the risks we discussed. Please call 1-800-QUIT-NOW (470-053-7755) for free smoking cessation counseling.

## 2012-09-17 ENCOUNTER — Ambulatory Visit: Payer: PRIVATE HEALTH INSURANCE | Admitting: Internal Medicine

## 2012-12-05 ENCOUNTER — Other Ambulatory Visit: Payer: Self-pay | Admitting: *Deleted

## 2012-12-05 DIAGNOSIS — Z79890 Hormone replacement therapy (postmenopausal): Secondary | ICD-10-CM

## 2012-12-05 MED ORDER — ESTROGENS, CONJUGATED 0.625 MG/GM VA CREA: TOPICAL_CREAM | VAGINAL | Status: DC

## 2012-12-05 NOTE — Telephone Encounter (Signed)
Rf for premarin sent to [facility] in [place]

## 2012-12-07 ENCOUNTER — Telehealth: Payer: Self-pay | Admitting: *Deleted

## 2012-12-07 NOTE — Telephone Encounter (Signed)
Pt called and was asking about her prescription of Permarin.      SW cma

## 2013-03-07 ENCOUNTER — Other Ambulatory Visit: Payer: Self-pay

## 2013-04-16 ENCOUNTER — Ambulatory Visit (INDEPENDENT_AMBULATORY_CARE_PROVIDER_SITE_OTHER): Payer: Medicare Other | Admitting: Internal Medicine

## 2013-04-16 ENCOUNTER — Encounter: Payer: Self-pay | Admitting: Internal Medicine

## 2013-04-16 VITALS — BP 132/88 | HR 66 | Temp 98.6°F | Resp 12 | Ht 64.75 in | Wt 129.4 lb

## 2013-04-16 DIAGNOSIS — F17200 Nicotine dependence, unspecified, uncomplicated: Secondary | ICD-10-CM

## 2013-04-16 DIAGNOSIS — I10 Essential (primary) hypertension: Secondary | ICD-10-CM

## 2013-04-16 DIAGNOSIS — H8110 Benign paroxysmal vertigo, unspecified ear: Secondary | ICD-10-CM

## 2013-04-16 MED ORDER — LOSARTAN POTASSIUM-HCTZ 100-25 MG PO TABS: ORAL_TABLET | ORAL | Status: DC

## 2013-04-16 NOTE — Patient Instructions (Signed)
Your next office appointment will be determined based upon review of your pending labs . Those instructions will be transmitted to you through My Chart  or by mail if you're not using this system.  Minimal Blood Pressure Goal= AVERAGE < 140/90;  Ideal is an AVERAGE < 135/85. This AVERAGE should be calculated from @ least 5-7 BP readings taken @ different times of day on different days of week. You should not respond to isolated BP readings , but rather the AVERAGE for that week .Please bring your  blood pressure cuff to office visits to verify that it is reliable.It  can also be checked against the blood pressure device at the pharmacy. Finger or wrist cuffs are not dependable; an arm cuff is. Please think about quitting smoking. Review the risks we discussed. Please call 1-800-QUIT-NOW (403-312-6871) for free smoking cessation counseling.

## 2013-04-16 NOTE — Progress Notes (Signed)
   Subjective:    Patient ID: Sherry Rice, female    DOB: [birth year], 67 y.o.   MRN: [id number]  HPI Blood pressure  average < 122/78.Compliant with anti hypertemsive medication. No lightheadedness or other adverse medication effect described.  A heart healthy diet is followed; exercise encompasses > 45 minutes > 6  times per week as treadmill, gym , bike , weights without symptoms.  Family history is negative for premature coronary disease.      Review of Systems Significant headaches, epistaxis, chest pain, palpitations, exertional dyspnea, claudication, paroxysmal nocturnal dyspnea, or edema absent. Significant abdominal symptoms, memory deficit, or myalgias not present.  She denies any cough, sputum, or shortness of breath. Chest x-ray in May of this year revealed no active disease. Also showed no cardiac enlargement.          Objective:   Physical Exam  Thin but appears healthy and well-nourished & in no acute distress  No carotid bruits are present.No neck pain distention present at 10 - 15 degrees. Thyroid normal to palpation  Heart rhythm and rate are normal with no significant murmurs or gallops.  Chest is clear with no increased work of breathing  There is no evidence of aortic aneurysm or renal artery bruits  Abdomen soft with no organomegaly or masses. No HJR  No clubbing, cyanosis or edema present. Marked DJD in fingers  Pedal pulses are intact   No ischemic skin changes are present . Nails healthy    Alert and oriented. Strength, tone, DTRs reflexes normal  Symptoms of benign positional vertigo when she was placed supine. These resolved when she was elevated to 15          Assessment & Plan:  #1 HTN controlled #2 BPV #3 smoker Plan: BMET

## 2013-04-16 NOTE — Progress Notes (Signed)
Pre visit review using our clinic review tool, if applicable. No additional management support is needed unless otherwise documented below in the visit note. 

## 2013-04-17 LAB — BASIC METABOLIC PANEL
BUN: 15 mg/dL (ref 6–23)
Chloride: 103 mEq/L (ref 96–112)
GFR: 128.49 mL/min (ref >60.00)
Glucose, Bld: 82 mg/dL (ref 70–99)
Potassium: 4 mEq/L (ref 3.5–5.1)

## 2013-07-04 ENCOUNTER — Other Ambulatory Visit: Payer: Self-pay

## 2013-07-04 DIAGNOSIS — Z1231 Encounter for screening mammogram for malignant neoplasm of breast: Secondary | ICD-10-CM

## 2013-07-24 ENCOUNTER — Telehealth: Payer: Self-pay | Admitting: Internal Medicine

## 2013-07-24 NOTE — Telephone Encounter (Signed)
Dr Alain Marion will be able to address this at the office visit 3/26. With her past medical history of breast cancer this is most appropriate

## 2013-07-24 NOTE — Telephone Encounter (Signed)
Patient called stating she has a history of breast cancer and has noticed a change in her L breast. She is requesting a diagnostic mammogram and ultrasound @ the breast center. Please advise.

## 2013-07-24 NOTE — Telephone Encounter (Signed)
Pt called to follow up on this request. Pt has an appt with Dr. Camila Li 07/25/13 at 9:15 am. Please advise

## 2013-07-25 ENCOUNTER — Ambulatory Visit: Payer: Medicare Other

## 2013-07-25 ENCOUNTER — Encounter: Payer: Self-pay | Admitting: Internal Medicine

## 2013-07-25 ENCOUNTER — Ambulatory Visit (INDEPENDENT_AMBULATORY_CARE_PROVIDER_SITE_OTHER): Payer: Medicare Other | Admitting: Internal Medicine

## 2013-07-25 VITALS — BP 132/94 | HR 80 | Temp 99.0°F | Resp 16 | Wt 130.0 lb

## 2013-07-25 DIAGNOSIS — N63 Unspecified lump in unspecified breast: Secondary | ICD-10-CM

## 2013-07-25 DIAGNOSIS — Z853 Personal history of malignant neoplasm of breast: Secondary | ICD-10-CM

## 2013-07-25 NOTE — Progress Notes (Signed)
Pre visit review using our clinic review tool, if applicable. No additional management support is needed unless otherwise documented below in the visit note. 

## 2013-07-25 NOTE — Telephone Encounter (Signed)
Pt was seen today in the office by Dr. Plotnikov.//AB/CMA

## 2013-07-25 NOTE — Assessment & Plan Note (Signed)
L breast 1995 Lumpectomy & 7.5 weeks Radiation L breast.  No Chemotherapy Dr Lennie Hummer  Diag mammo and [place]

## 2013-07-25 NOTE — Assessment & Plan Note (Signed)
  1995 Lumpectomy & 7.5 weeks Radiation L breast.  No Chemotherapy Dr Lennie Hummer

## 2013-07-25 NOTE — Progress Notes (Signed)
   Subjective:    Patient ID: Sherry Rice, female    DOB: [birth year], 68 y.o.   MRN: [id number]  HPI  C/o breast lump on L 2 months ago She is s/p XRT and lumpectomy L breast ca 19 years ago   Review of Systems  Constitutional: Negative for fatigue and unexpected weight change.  Skin: Negative for rash.       Objective:   Physical Exam  Constitutional: She appears well-developed and well-nourished. No distress.  Pulmonary/Chest: She exhibits no tenderness.  R breast WNL L breast w/post-op deformities; no obvious mass palpable No adenopathy          Assessment & Plan:

## 2013-07-26 ENCOUNTER — Ambulatory Visit
Admission: RE | Admit: 2013-07-26 | Discharge: 2013-07-26 | Disposition: A | Discharge status: Home / Self Care | Payer: Medicare Other | Source: Ambulatory Visit | Attending: Internal Medicine | Admitting: Internal Medicine

## 2013-07-26 DIAGNOSIS — N63 Unspecified lump in unspecified breast: Secondary | ICD-10-CM

## 2013-07-26 DIAGNOSIS — Z853 Personal history of malignant neoplasm of breast: Secondary | ICD-10-CM

## 2013-10-07 ENCOUNTER — Other Ambulatory Visit: Payer: Self-pay | Admitting: Internal Medicine

## 2014-08-26 ENCOUNTER — Other Ambulatory Visit: Payer: Self-pay

## 2014-08-26 DIAGNOSIS — Z1231 Encounter for screening mammogram for malignant neoplasm of breast: Secondary | ICD-10-CM

## 2014-09-15 ENCOUNTER — Ambulatory Visit
Admission: RE | Admit: 2014-09-15 | Discharge: 2014-09-15 | Disposition: A | Discharge status: Home / Self Care | Payer: Medicare Other | Source: Ambulatory Visit

## 2014-09-15 DIAGNOSIS — Z1231 Encounter for screening mammogram for malignant neoplasm of breast: Secondary | ICD-10-CM

## 2014-11-20 ENCOUNTER — Other Ambulatory Visit: Payer: Self-pay | Admitting: Family Medicine

## 2014-11-20 ENCOUNTER — Ambulatory Visit
Admission: RE | Admit: 2014-11-20 | Discharge: 2014-11-20 | Disposition: A | Discharge status: Home / Self Care | Payer: Medicare Other | Source: Ambulatory Visit | Attending: Family Medicine | Admitting: Family Medicine

## 2014-11-20 DIAGNOSIS — R05 Cough: Secondary | ICD-10-CM

## 2014-11-20 DIAGNOSIS — R059 Cough, unspecified: Secondary | ICD-10-CM

## 2014-12-24 ENCOUNTER — Ambulatory Visit
Admission: RE | Admit: 2014-12-24 | Discharge: 2014-12-24 | Disposition: A | Discharge status: Home / Self Care | Payer: Medicare Other | Source: Ambulatory Visit | Attending: Family Medicine | Admitting: Family Medicine

## 2014-12-24 ENCOUNTER — Other Ambulatory Visit: Payer: Self-pay | Admitting: Family Medicine

## 2014-12-24 DIAGNOSIS — J189 Pneumonia, unspecified organism: Secondary | ICD-10-CM

## 2015-04-30 ENCOUNTER — Telehealth: Payer: Self-pay | Admitting: Acute Care

## 2015-04-30 ENCOUNTER — Encounter: Payer: Self-pay | Admitting: Acute Care

## 2015-04-30 NOTE — Telephone Encounter (Signed)
Spoke to patient. Patient smokes .5 ppd for 45 years. With this information, patient does not qualify for the program. The program requires for the patient to be a 30 pack year smoker and patient is currently a 22.5 pack year smoker. Sherry Rice can still order a CT, however patient will not qualify for the Lung Cancer Screening Program.

## 2015-10-12 ENCOUNTER — Other Ambulatory Visit: Payer: Self-pay | Admitting: Family Medicine

## 2015-10-12 DIAGNOSIS — Z853 Personal history of malignant neoplasm of breast: Secondary | ICD-10-CM

## 2015-10-12 DIAGNOSIS — Z1231 Encounter for screening mammogram for malignant neoplasm of breast: Secondary | ICD-10-CM

## 2015-10-26 ENCOUNTER — Ambulatory Visit: Payer: Medicare Other

## 2015-10-27 ENCOUNTER — Ambulatory Visit
Admission: RE | Admit: 2015-10-27 | Discharge: 2015-10-27 | Disposition: A | Discharge status: Home / Self Care | Payer: Medicare Other | Source: Ambulatory Visit | Attending: Family Medicine | Admitting: Family Medicine

## 2015-10-27 DIAGNOSIS — Z853 Personal history of malignant neoplasm of breast: Secondary | ICD-10-CM

## 2015-10-27 DIAGNOSIS — Z1231 Encounter for screening mammogram for malignant neoplasm of breast: Secondary | ICD-10-CM

## 2015-11-02 ENCOUNTER — Other Ambulatory Visit: Payer: Self-pay | Admitting: Family Medicine

## 2015-11-02 DIAGNOSIS — R928 Other abnormal and inconclusive findings on diagnostic imaging of breast: Secondary | ICD-10-CM

## 2015-11-09 ENCOUNTER — Other Ambulatory Visit: Payer: Self-pay | Admitting: Family Medicine

## 2015-11-09 ENCOUNTER — Ambulatory Visit
Admission: RE | Admit: 2015-11-09 | Discharge: 2015-11-09 | Disposition: A | Discharge status: Home / Self Care | Payer: Medicare Other | Source: Ambulatory Visit | Attending: Family Medicine | Admitting: Family Medicine

## 2015-11-09 DIAGNOSIS — R928 Other abnormal and inconclusive findings on diagnostic imaging of breast: Secondary | ICD-10-CM

## 2015-11-09 DIAGNOSIS — N6320 Unspecified lump in the left breast, unspecified quadrant: Secondary | ICD-10-CM

## 2015-11-10 ENCOUNTER — Ambulatory Visit
Admission: RE | Admit: 2015-11-10 | Discharge: 2015-11-10 | Disposition: A | Discharge status: Home / Self Care | Payer: Medicare Other | Source: Ambulatory Visit | Attending: Family Medicine | Admitting: Family Medicine

## 2015-11-10 ENCOUNTER — Other Ambulatory Visit: Payer: Self-pay | Admitting: Family Medicine

## 2015-11-10 DIAGNOSIS — N6320 Unspecified lump in the left breast, unspecified quadrant: Secondary | ICD-10-CM

## 2015-11-16 ENCOUNTER — Telehealth: Payer: Self-pay | Admitting: *Deleted

## 2015-11-16 ENCOUNTER — Other Ambulatory Visit: Payer: Self-pay | Admitting: General Surgery

## 2015-11-16 DIAGNOSIS — C50912 Malignant neoplasm of unspecified site of left female breast: Secondary | ICD-10-CM

## 2015-11-16 DIAGNOSIS — Z803 Family history of malignant neoplasm of breast: Secondary | ICD-10-CM

## 2015-11-16 NOTE — Telephone Encounter (Signed)
Error

## 2015-11-17 ENCOUNTER — Telehealth: Payer: Self-pay | Admitting: Hematology and Oncology

## 2015-11-17 NOTE — Telephone Encounter (Signed)
Telephone call to patient to verify demographic information. Appointment was made with Dawn.

## 2015-11-19 ENCOUNTER — Encounter: Payer: Self-pay | Admitting: *Deleted

## 2015-11-19 ENCOUNTER — Encounter: Payer: Self-pay | Admitting: Hematology and Oncology

## 2015-11-19 ENCOUNTER — Ambulatory Visit (HOSPITAL_BASED_OUTPATIENT_CLINIC_OR_DEPARTMENT_OTHER): Payer: Medicare Other | Admitting: Hematology and Oncology

## 2015-11-19 VITALS — BP 130/82 | HR 108 | Temp 98.7°F | Resp 18 | Wt 124.1 lb

## 2015-11-19 DIAGNOSIS — Z720 Tobacco use: Secondary | ICD-10-CM | POA: Diagnosis not present

## 2015-11-19 DIAGNOSIS — Z800 Family history of malignant neoplasm of digestive organs: Secondary | ICD-10-CM

## 2015-11-19 DIAGNOSIS — Z803 Family history of malignant neoplasm of breast: Secondary | ICD-10-CM | POA: Diagnosis not present

## 2015-11-19 DIAGNOSIS — C50412 Malignant neoplasm of upper-outer quadrant of left female breast: Secondary | ICD-10-CM

## 2015-11-19 MED ORDER — ANASTROZOLE 1 MG PO TABS: 1.0000 mg | ORAL_TABLET | Freq: Every day | ORAL | Status: DC

## 2015-11-19 NOTE — Assessment & Plan Note (Signed)
11/10/2015: Left breast biopsy 2:00, 1.5 x 0.7 x 0.7 cm : Grade 1, IDC with ADH ER 100%, PR 0%, Ki-67 10%, HER-2 neg ratio 1.28; 12:00: 2.8 cm Inv mamm ca grade 1-2,  ER 90%, PR 30%, Ki-67 10%, HER-2 neg ratio 1.15; total size 4.8 cm, T2 N0 stage II a clinical stage  Pathology and radiology counseling:Discussed with the patient, the details of pathology including the type of breast cancer,the clinical staging, the significance of ER, PR and HER-2/neu receptors and the implications for treatment. After reviewing the pathology in detail, we proceeded to discuss the different treatment options between surgery, radiation, chemotherapy, antiestrogen therapies.  Recommendations: 1. Left mastectomy followed by 2. Oncotype DX testing on both the tumors to determine if chemotherapy would be of any benefit followed by 3. Adjuvant antiestrogen therapy with anastrozole 1 mg daily  Oncotype counseling: I discussed Oncotype DX test. I explained to the patient that this is a 21 gene panel to evaluate patient tumors DNA to calculate recurrence score. This would help determine whether patient has high risk or intermediate risk or low risk breast cancer. She understands that if her tumor was found to be high risk, she would benefit from systemic chemotherapy. If low risk, no need of chemotherapy. If she was found to be intermediate risk, we would need to evaluate the score as well as other risk factors and determine if an abbreviated chemotherapy may be of benefit.  Return to clinic after surgery to discuss final pathology report and then determine if Oncotype DX testing will need to be sent.

## 2015-11-19 NOTE — Progress Notes (Signed)
[place] NOTE  Patient Care Team: London Pepper, MD as PCP - General (Family Medicine) Laurence Spates, MD (Gastroenterology) Jari Pigg, MD (Dermatology)  CHIEF COMPLAINTS/PURPOSE OF CONSULTATION:  Newly diagnosed breast cancer  HISTORY OF PRESENTING ILLNESS:  Sherry Rice 70 y.o. female is here because of recent diagnosis of left breast cancer. She had a routine screening mammogram the detected abnormalities in the left breast which further led to diagnostic mammograms and ultrasound and ultrasound-guided biopsy. The mammograms revealed 2 masses on at 2:00 position was 1.5 cm and the biopsy revealed invasive ductal carcinoma that was ER 100%, PR 0%, Ki-67 was 10%, HER-2 negative. The mass at 12:00 position measured 2.8 cm and on biopsy this was a grade 1-2 invasive mammary cancer that was ER 90%, PR 30%, Ki-67 10%, HER-2 negative ratio 1.15. Total size by ultrasound was 4.8 cm. She was seen by Dr. Dalbert Batman who is planning to obtain a breast MRI. She is planning to go bilateral mastectomies with reconstruction and it appears that the surgical procedures will be delayed by at least a month. Patient is here today accompanied by her daughter who works on [address] at [employer].  I reviewed her records extensively and collaborated the history with the patient.  SUMMARY OF ONCOLOGIC HISTORY:   Breast cancer of upper-outer quadrant of left female breast ([place])   11/10/2015 Initial Diagnosis Left breast biopsy 2:00, 1.5 x 0.7 x 0.7 cm : Grade 1, IDC with ADH ER 100%, PR 0%, Ki-67 10%, HER-2 neg ratio 1.28; 12:00: 2.8 cm Inv mamm ca grade 1-2,  ER 90%, PR 30%, Ki-67 10%, HER-2 neg ratio 1.15; total size 4.8 cm, T2 N0 stage II a clinical stage    MEDICAL HISTORY:  Past Medical History  Diagnosis Date  . Anxiety     PMH of  . Hypertension   . Cancer ([place])     breast, skin - basal cell and squamous cell    SURGICAL HISTORY: Past Surgical History  Procedure  Laterality Date  . Breast lumpectomy  1995    radiation w/o chemotherapy  . Colonoscopy  2007 & 2012    negative x 2 , Dr  Laurence Spates  . Tonsillectomy and adenoidectomy    . Lymph node resection  1995    17 LN negative    SOCIAL HISTORY: Social History   Social History  . Marital Status: Married    Spouse Name: N/A  . Number of Children: N/A  . Years of Education: N/A   Occupational History  . Not on file.   Social History Main Topics  . Smoking status: Current Every Day Smoker -- 0.50 packs/day for 45 years    Types: Cigarettes  . Smokeless tobacco: Not on file     Comment: smoked 1969-present, max 1 ppd , now 1/2 ppd  . Alcohol Use: 8.4 oz/week    14 Glasses of wine per week  . Drug Use: No  . Sexual Activity: Not on file   Other Topics Concern  . Not on file   Social History Narrative    FAMILY HISTORY: Family History  Problem Relation Age of Onset  . Osteoarthritis Mother   . Breast cancer Mother   . Hyperlipidemia Mother   . Hypertension Mother   . Hypertension Father   . Diabetes Father     diet controlled  . Hypertension Sister   . Colon cancer Paternal Grandmother   . Stroke Neg Hx   .  Heart disease Neg Hx     ALLERGIES:  is allergic to codeine.  MEDICATIONS:  Current Outpatient Prescriptions  Medication Sig Dispense Refill  . losartan-hydrochlorothiazide (HYZAAR) 100-25 MG per tablet TAKE 1 TABLET BY MOUTH EVERY DAY 90 tablet 1  . LUTEIN PO Take by mouth daily.    Marland Kitchen MILK THISTLE PO Take by mouth daily.    . vitamin C (ASCORBIC ACID) 500 MG tablet Take 500 mg by mouth daily.     No current facility-administered medications for this visit.    REVIEW OF SYSTEMS:   Constitutional: Denies fevers, chills or abnormal night sweats Eyes: Denies blurriness of vision, double vision or watery eyes Ears, nose, mouth, throat, and face: Denies mucositis or sore throat Respiratory: Denies cough, dyspnea or wheezes Cardiovascular: Denies palpitation,  chest discomfort or lower extremity swelling Gastrointestinal:  Denies nausea, heartburn or change in bowel habits Skin: Denies abnormal skin rashes Lymphatics: Denies new lymphadenopathy or easy bruising Neurological:Denies numbness, tingling or new weaknesses Behavioral/Psych: Mood is stable, no new changes  Breast:  Denies any palpable lumps or discharge All other systems were reviewed with the patient and are negative.  PHYSICAL EXAMINATION: ECOG PERFORMANCE STATUS: 0 - Asymptomatic  There were no vitals filed for this visit. There were no vitals filed for this visit.  GENERAL:alert, no distress and comfortable SKIN: skin color, texture, turgor are normal, no rashes or significant lesions EYES: normal, conjunctiva are pink and non-injected, sclera clear OROPHARYNX:no exudate, no erythema and lips, buccal mucosa, and tongue normal  NECK: supple, thyroid normal size, non-tender, without nodularity LYMPH:  no palpable lymphadenopathy in the cervical, axillary or inguinal LUNGS: clear to auscultation and percussion with normal breathing effort HEART: regular rate & rhythm and no murmurs and no lower extremity edema ABDOMEN:abdomen soft, non-tender and normal bowel sounds Musculoskeletal:no cyanosis of digits and no clubbing  PSYCH: alert & oriented x 3 with fluent speech NEURO: no focal motor/sensory deficits BREAST: No palpable nodules in breast. No palpable axillary or supraclavicular lymphadenopathy (exam performed in the presence of a chaperone)   LABORATORY DATA:  I have reviewed the data as listed Lab Results  Component Value Date   WBC 6.9 04/12/2012   HGB 14.9 04/12/2012   HCT 43.4 04/12/2012   MCV 104.7* 04/12/2012   PLT 230.0 04/12/2012   Lab Results  Component Value Date   NA 138 04/16/2013   K 4.0 04/16/2013   CL 103 04/16/2013   CO2 28 04/16/2013    RADIOGRAPHIC STUDIES: I have personally reviewed the radiological reports and agreed with the findings in  the report.  ASSESSMENT AND PLAN:  Breast cancer of upper-outer quadrant of left female breast ([place]) 11/10/2015: Left breast biopsy 2:00, 1.5 x 0.7 x 0.7 cm : Grade 1, IDC with ADH ER 100%, PR 0%, Ki-67 10%, HER-2 neg ratio 1.28; 12:00: 2.8 cm Inv mamm ca grade 1-2,  ER 90%, PR 30%, Ki-67 10%, HER-2 neg ratio 1.15; total size 4.8 cm, T2 N0 stage II a clinical stage  Pathology and radiology counseling:Discussed with the patient, the details of pathology including the type of breast cancer,the clinical staging, the significance of ER, PR and HER-2/neu receptors and the implications for treatment. After reviewing the pathology in detail, we proceeded to discuss the different treatment options between surgery, radiation, chemotherapy, antiestrogen therapies.  Recommendations: Breast MRI is being planned 1. Mastectomy (patient contemplating on bilateral mastectomies with reconstruction) 2. Oncotype DX testing on both the tumors to determine if chemotherapy would  be of any benefit followed by 3. Adjuvant antiestrogen therapy with anastrozole 1 mg daily  Patient will be scheduled for CT chest abdomen pelvis with contrast for staging I will call the patient with the results of the scans.  Oncotype counseling: I discussed Oncotype DX test. I explained to the patient that this is a 21 gene panel to evaluate patient tumors DNA to calculate recurrence score. This would help determine whether patient has high risk or intermediate risk or low risk breast cancer. She understands that if her tumor was found to be high risk, she would benefit from systemic chemotherapy. If low risk, no need of chemotherapy. If she was found to be intermediate risk, we would need to evaluate the score as well as other risk factors and determine if an abbreviated chemotherapy may be of benefit.  Because there is still a substantial time from now until her surgery, I recommended starting her on anastrozole. I discussed the pros and  cons of anastrozole including the risk of hot flashes, myalgias as well as risk of osteoporosis.  Return to clinic after surgery to discuss final pathology report and then determine if Oncotype DX testing will need to be sent.  All questions were answered. The patient knows to call the clinic with any problems, questions or concerns.    Rulon Eisenmenger, MD 11/19/2015

## 2015-11-23 ENCOUNTER — Other Ambulatory Visit: Payer: Medicare Other

## 2015-11-24 ENCOUNTER — Other Ambulatory Visit: Payer: Self-pay

## 2015-11-24 DIAGNOSIS — C50412 Malignant neoplasm of upper-outer quadrant of left female breast: Secondary | ICD-10-CM

## 2015-11-25 ENCOUNTER — Other Ambulatory Visit (HOSPITAL_BASED_OUTPATIENT_CLINIC_OR_DEPARTMENT_OTHER): Payer: Medicare Other

## 2015-11-25 ENCOUNTER — Encounter: Payer: Self-pay | Admitting: Genetic Counselor

## 2015-11-25 DIAGNOSIS — C50412 Malignant neoplasm of upper-outer quadrant of left female breast: Secondary | ICD-10-CM

## 2015-11-25 LAB — CBC WITH DIFFERENTIAL/PLATELET
BASO%: 0.3 % (ref 0.0–2.0)
Basophils Absolute: 0 10*3/uL (ref 0.0–0.1)
EOS ABS: 0.1 10*3/uL (ref 0.0–0.5)
EOS%: 1.9 % (ref 0.0–7.0)
HCT: 42.6 % (ref 34.8–46.6)
HGB: 14.3 g/dL (ref 11.6–15.9)
LYMPH%: 43.4 % (ref 14.0–49.7)
MCH: 34.7 pg — ABNORMAL HIGH (ref 25.1–34.0)
MCHC: 33.6 g/dL (ref 31.5–36.0)
MCV: 103.5 fL — AB (ref 79.5–101.0)
MONO#: 0.6 10*3/uL (ref 0.1–0.9)
MONO%: 8.2 % (ref 0.0–14.0)
NEUT%: 46.2 % (ref 38.4–76.8)
NEUTROS ABS: 3.5 10*3/uL (ref 1.5–6.5)
PLATELETS: 213 10*3/uL (ref 145–400)
RBC: 4.12 10*6/uL (ref 3.70–5.45)
RDW: 12.7 % (ref 11.2–14.5)
WBC: 7.6 10*3/uL (ref 3.9–10.3)
lymph#: 3.3 10*3/uL (ref 0.9–3.3)

## 2015-11-25 LAB — COMPREHENSIVE METABOLIC PANEL
ALBUMIN: 4 g/dL (ref 3.5–5.0)
ALK PHOS: 82 U/L (ref 40–150)
ALT: 23 U/L (ref 0–55)
AST: 24 U/L (ref 5–34)
Anion Gap: 9 mEq/L (ref 3–11)
BILIRUBIN TOTAL: 0.59 mg/dL (ref 0.20–1.20)
BUN: 15.5 mg/dL (ref 7.0–26.0)
CALCIUM: 9.2 mg/dL (ref 8.4–10.4)
CO2: 27 mEq/L (ref 22–29)
CREATININE: 0.8 mg/dL (ref 0.6–1.1)
Chloride: 106 mEq/L (ref 98–109)
EGFR: 78 mL/min/{1.73_m2} — ABNORMAL LOW (ref >90)
Glucose: 117 mg/dl (ref 70–140)
Potassium: 3.8 mEq/L (ref 3.5–5.1)
SODIUM: 142 meq/L (ref 136–145)
TOTAL PROTEIN: 6.8 g/dL (ref 6.4–8.3)

## 2015-11-26 ENCOUNTER — Ambulatory Visit (HOSPITAL_BASED_OUTPATIENT_CLINIC_OR_DEPARTMENT_OTHER): Payer: Medicare Other | Admitting: Genetic Counselor

## 2015-11-26 ENCOUNTER — Ambulatory Visit (HOSPITAL_COMMUNITY)
Admission: RE | Admit: 2015-11-26 | Discharge: 2015-11-26 | Disposition: A | Discharge status: Home / Self Care | Payer: Medicare Other | Source: Ambulatory Visit | Attending: Hematology and Oncology | Admitting: Hematology and Oncology

## 2015-11-26 ENCOUNTER — Encounter (HOSPITAL_COMMUNITY): Payer: Self-pay

## 2015-11-26 ENCOUNTER — Other Ambulatory Visit: Payer: Medicare Other

## 2015-11-26 DIAGNOSIS — C50412 Malignant neoplasm of upper-outer quadrant of left female breast: Secondary | ICD-10-CM

## 2015-11-26 DIAGNOSIS — K869 Disease of pancreas, unspecified: Secondary | ICD-10-CM | POA: Diagnosis not present

## 2015-11-26 DIAGNOSIS — Z85828 Personal history of other malignant neoplasm of skin: Secondary | ICD-10-CM

## 2015-11-26 DIAGNOSIS — Z800 Family history of malignant neoplasm of digestive organs: Secondary | ICD-10-CM | POA: Diagnosis not present

## 2015-11-26 DIAGNOSIS — Z803 Family history of malignant neoplasm of breast: Secondary | ICD-10-CM

## 2015-11-26 DIAGNOSIS — Z853 Personal history of malignant neoplasm of breast: Secondary | ICD-10-CM | POA: Diagnosis not present

## 2015-11-26 DIAGNOSIS — Z808 Family history of malignant neoplasm of other organs or systems: Secondary | ICD-10-CM

## 2015-11-26 DIAGNOSIS — R590 Localized enlarged lymph nodes: Secondary | ICD-10-CM | POA: Diagnosis not present

## 2015-11-26 DIAGNOSIS — Z315 Encounter for genetic counseling: Secondary | ICD-10-CM

## 2015-11-26 MED ORDER — IOPAMIDOL (ISOVUE-300) INJECTION 61%
100.0000 mL | Freq: Once | INTRAVENOUS | Status: AC | PRN
  Administered 2015-11-26: 100 mL via INTRAVENOUS

## 2015-11-27 ENCOUNTER — Encounter: Payer: Self-pay | Admitting: Genetic Counselor

## 2015-11-27 DIAGNOSIS — Z803 Family history of malignant neoplasm of breast: Secondary | ICD-10-CM | POA: Insufficient documentation

## 2015-11-27 NOTE — Progress Notes (Signed)
REFERRING PROVIDER: Nicholas Lose, MD  PRIMARY PROVIDER:  London Pepper, MD  PRIMARY REASON FOR VISIT:  1. Breast cancer of upper-outer quadrant of left female breast ([place])   2. BREAST CANCER, HX OF   3. SKIN CANCER, HX OF   4. Family history of breast cancer in female   51. Family history of nonmelanoma skin cancer   6. Family history of colon cancer      HISTORY OF PRESENT ILLNESS:   Ms. Louks, a 70 y.o. female, was seen for a [place] cancer genetics consultation at the request of Dr. Lindi Adie due to a personal multiple breast cancer primaries and family history of breast and other cancers.  Ms. Kopinski presents to clinic today to discuss the possibility of a hereditary predisposition to cancer, genetic testing, and to further clarify her future cancer risks, as well as potential cancer risks for family members.   In 1995, at the age of 86, Ms. Yore was diagnosed with cancer of the left breast.  This was treated with lumpectomy and radiation.  More recently, July 2017, at the age of 86, Ms. Pitcher was diagnosed with invasive ductal carcinoma (x2) with atypical ductal hyperplasia at the age of 50.  Hormone receptor statuses were ER+, PR-, Her2- and ER/PR+, Her2-.  Genetic test results will help inform surgical and treatment decisions.  Ms. Tennant also has a history of basal and squamous cell carcinoma skin cancers.   CANCER HISTORY:    Breast cancer of upper-outer quadrant of left female breast ([place])   11/10/2015 Initial Diagnosis    Left breast biopsy 2:00, 1.5 x 0.7 x 0.7 cm : Grade 1, IDC with ADH ER 100%, PR 0%, Ki-67 10%, HER-2 neg ratio 1.28; 12:00: 2.8 cm Inv mamm ca grade 1-2,  ER 90%, PR 30%, Ki-67 10%, HER-2 neg ratio 1.15; total size 4.8 cm, T2 N0 stage II a clinical stage       HORMONAL RISK FACTORS:  Menarche was at age 35.  First live birth at age 5.  OCP use for approximately 2 years.  Ovaries intact: yes.  Hysterectomy: no.  Menopausal status: postmenopausal.  HRT  use: 0 years. Colonoscopy: yes; in 20017 and 2012 - no polyps. Mammogram within the last year: yes. Number of breast biopsies: multiple. Up to date with pelvic exams:  n/a. Any excessive radiation exposure/other exposures in the past:  Reports history of some additional secondhand smoke exposure (ex-husband smoked)  Past Medical History:  Diagnosis Date  . Anxiety    PMH of  . Cancer ([place])    breast, skin - basal cell and squamous cell  . Hypertension     Past Surgical History:  Procedure Laterality Date  . BREAST LUMPECTOMY  1995   radiation w/o chemotherapy  . COLONOSCOPY  2007 & 2012   negative x 2 , Dr  Laurence Spates  . lymph node resection  1995   17 LN negative  . TONSILLECTOMY AND ADENOIDECTOMY      Social History   Social History  . Marital status: Married    Spouse name: N/A  . Number of children: N/A  . Years of education: N/A   Social History Main Topics  . Smoking status: Current Every Day Smoker    Packs/day: 0.50    Years: 48.00    Types: Cigarettes  . Smokeless tobacco: Never Used     Comment: smoked 1969-present, max 1 ppd , now 1/2 ppd  . Alcohol use 8.4 oz/week  14 Glasses of wine per week     Comment: 3 glasses of wine most days  . Drug use: No  . Sexual activity: Not Asked   Other Topics Concern  . None   Social History Narrative  . None     FAMILY HISTORY:  We obtained a detailed, 4-generation family history.  Significant diagnoses are listed below: Family History  Problem Relation Age of Onset  . Osteoarthritis Mother   . Breast cancer Mother 35  . Hyperlipidemia Mother   . Hypertension Mother   . Skin cancer Mother     dx squamous and basal cell carcinomas; +sun exposure  . Hypertension Father   . Diabetes Father     diet controlled  . Dementia Father   . Skin cancer Father     hx basal cell carcinoma  . Hypertension Sister   . Other Sister 33    hx of hysterectomy for unspecified reason  . Colon cancer Paternal  Grandmother 52  . Other Maternal Grandfather     blood or kidney disease; d. 17  . Stroke Paternal Grandfather 27  . Skin cancer Daughter     basal or squamous type  . Skin cancer Daughter     basal or squamous type  . Skin cancer Daughter     basal or squamous type  . Breast cancer Cousin 62    paternal 1st cousin  . Heart disease Neg Hx     Ms. Laguardia has three daughters--two twin daughters who are 44 and another daughter who is 23.  Each of her daughters has a history of non-melanoma skin cancers.  Ms. Chastang has one full sister who is currently 3.  This sister has never had cancer, but has a history of a hysterectomy at age 61 for an unspecified reason.    Ms. Briceno mother is currently 53.  She was diagnosed with breast cancer at age 69; she also has a history of basal and squamous cell carcinoma skin cancers, which Ms. Shadd attributes to frequent sun exposure.  Ms. Barrientes mother had two full sisters.  One passed away at 60 and never had cancer.  This sister had no children.  The other sister passed away at 86 and never had cancer.  This sister had one son who is currently 74 and who has not had cancer.  Ms. Dulski maternal grandmother passed away at 37, but not from cancer.  Her maternal grandfather died of either blood or a kidney disease at the age of 62.  Ms. Revuelta had no information for any maternal great aunts/uncles or great grandparents.    Ms. Carignan father is currently 13 and has dementia.  He has a history of basal cell carcinoma skin cancer.  He has one full sister and one full brother, ages 2 and 67, who have never had cancer.  His brother has a daughter who was diagnosed with breast cancer at the age of 33.  Ms. Zurn paternal grandmother was diagnosed with colon cancer at the age of 79-80 and passed away at age 63.  Ms. Trafton paternal grandfather died of a stroke at age 62.  Ms. Kincannon had no information for her paternal great aunts/uncles or great grandparents.     Ms. Axelrod is unaware of any family history of genetic testing for hereditary cancer.  Patient's maternal and paternal ancestors are of English descent. There is no reported Ashkenazi Jewish ancestry. There is no known consanguinity.  GENETIC COUNSELING ASSESSMENT: Manvi Guilliams  Knoche is a 70 y.o. female with a personal and family history of breast cancer which is somewhat suggestive of a hereditary breast cancer syndrome and predisposition to cancer. We, therefore, discussed and recommended the following at today's visit.   DISCUSSION: We reviewed the characteristics, features and inheritance patterns of hereditary cancer syndromes, particularly those caused by mutations within the BRCA1/2 and CHEK2 genes. We also discussed genetic testing, including the appropriate family members to test, the process of testing, insurance coverage and turn-around-time for results. We discussed the implications of a negative, positive and/or variant of uncertain significant result. We recommended Ms. Lehrman pursue genetic testing for the 20-gene Breast/Ovarian Cancer Panel.  The Breast/Ovarian Cancer Panel offered by [facility] Hope Pigeon, MD) includes sequencing and deletion/duplication analysis for the following 19 genes:  ATM, BARD1, BRCA1, BRCA2, BRIP1, CDH1, CHEK2, FANCC, MLH1, MSH2, MSH6, NBN, PALB2, PMS2, PTEN, RAD51C, RAD51D, TP53, and XRCC2.  This panel also includes deletion/duplication analysis (without sequencing) for one gene, EPCAM.  Based on Ms. Samford's personal and family history of cancer, she meets medical criteria for genetic testing. Despite that she meets criteria, she may still have an out of pocket cost. We discussed that if her out of pocket cost for testing is over $100, the laboratory will call and confirm whether she wants to proceed with testing.  If the out of pocket cost of testing is less than $100 she will be billed by the genetic testing laboratory.   PLAN: After considering the  risks, benefits, and limitations, Ms. Hult  provided informed consent to pursue genetic testing and the blood sample was sent to [facility] for analysis of the 20-gene Breast/Ovarian Cancer Panel. Results should be available within approximately 2 weeks' time, at which point they will be disclosed by telephone to Ms. Hendriks, as will any additional recommendations warranted by these results. Ms. Slawson will receive a summary of her genetic counseling visit and a copy of her results once available. This information will also be available in Epic. We encouraged Ms. Lindahl to remain in contact with cancer genetics annually so that we can continuously update the family history and inform her of any changes in cancer genetics and testing that may be of benefit for her family. Ms. Kretsch questions were answered to her satisfaction today. Our contact information was provided should additional questions or concerns arise.  Thank you for the referral and allowing [place] to share in the care of your patient.   Jeanine Luz, MS, [facility] Certified Genetic Counselor [email or web address] Phone: [phone]  The patient was seen for a total of 60 minutes in face-to-face genetic counseling.  This patient was discussed with Drs. Magrinat, Lindi Adie and/or Burr Medico who agrees with the above.    _______________________________________________________________________ For Office Staff:  Number of people involved in session: 1 Was an Intern/ student involved with case: no

## 2015-12-01 ENCOUNTER — Other Ambulatory Visit: Payer: Self-pay | Admitting: General Surgery

## 2015-12-02 ENCOUNTER — Other Ambulatory Visit: Payer: Self-pay | Admitting: General Surgery

## 2015-12-02 DIAGNOSIS — C50112 Malignant neoplasm of central portion of left female breast: Secondary | ICD-10-CM

## 2015-12-08 ENCOUNTER — Telehealth: Payer: Self-pay | Admitting: Genetic Counselor

## 2015-12-08 NOTE — Telephone Encounter (Signed)
Discussed with [name] that her genetic test results were negative for pathogenic mutations within any of 20 genes on the Breast/Ovarian Cancer Panel (with MSH2 Exons 1-7 Inversion Analysis) through [facility].  Additionally, no uncertain changes were found.  Discussed that this most likely a reassuring result.  Encouraged [name] to continue to follow her doctors' recommendations for future cancer screening.  The women in the family are still at an increased risk for breast cancer, simply due to the family history.  [name] daughters and sister should continue to have annual mammograms, her granddaughters can begin annual mammograms at the age of 75, due to her history of breast cancer at age 70.  [name] is happy to receive this news.  She is welcome to call with any questions.  She would like a copy of her result mailed to her home address and I am happy to do that.

## 2015-12-09 ENCOUNTER — Ambulatory Visit: Payer: Self-pay | Admitting: Genetic Counselor

## 2015-12-09 DIAGNOSIS — Z809 Family history of malignant neoplasm, unspecified: Secondary | ICD-10-CM

## 2015-12-09 DIAGNOSIS — Z853 Personal history of malignant neoplasm of breast: Secondary | ICD-10-CM

## 2015-12-09 DIAGNOSIS — Z1379 Encounter for other screening for genetic and chromosomal anomalies: Secondary | ICD-10-CM

## 2015-12-09 DIAGNOSIS — C50412 Malignant neoplasm of upper-outer quadrant of left female breast: Secondary | ICD-10-CM

## 2015-12-09 DIAGNOSIS — Z803 Family history of malignant neoplasm of breast: Secondary | ICD-10-CM

## 2015-12-10 ENCOUNTER — Ambulatory Visit
Admission: RE | Admit: 2015-12-10 | Discharge: 2015-12-10 | Disposition: A | Discharge status: Home / Self Care | Payer: Medicare Other | Source: Ambulatory Visit | Attending: General Surgery | Admitting: General Surgery

## 2015-12-10 DIAGNOSIS — C50912 Malignant neoplasm of unspecified site of left female breast: Secondary | ICD-10-CM

## 2015-12-10 DIAGNOSIS — Z803 Family history of malignant neoplasm of breast: Secondary | ICD-10-CM

## 2015-12-10 MED ORDER — GADOBENATE DIMEGLUMINE 529 MG/ML IV SOLN: 11.0000 mL | Freq: Once | INTRAVENOUS | Status: DC | PRN

## 2015-12-11 ENCOUNTER — Other Ambulatory Visit: Payer: Self-pay | Admitting: *Deleted

## 2015-12-11 ENCOUNTER — Encounter (HOSPITAL_BASED_OUTPATIENT_CLINIC_OR_DEPARTMENT_OTHER): Payer: Self-pay | Admitting: *Deleted

## 2015-12-15 ENCOUNTER — Encounter (HOSPITAL_BASED_OUTPATIENT_CLINIC_OR_DEPARTMENT_OTHER)
Admission: RE | Admit: 2015-12-15 | Discharge: 2015-12-15 | Disposition: A | Discharge status: Home / Self Care | Payer: Medicare Other | Source: Ambulatory Visit | Attending: General Surgery | Admitting: General Surgery

## 2015-12-15 ENCOUNTER — Other Ambulatory Visit: Payer: Self-pay

## 2015-12-15 DIAGNOSIS — D0582 Other specified type of carcinoma in situ of left breast: Secondary | ICD-10-CM | POA: Diagnosis not present

## 2015-12-15 DIAGNOSIS — Z170 Estrogen receptor positive status [ER+]: Secondary | ICD-10-CM | POA: Diagnosis not present

## 2015-12-15 DIAGNOSIS — F17200 Nicotine dependence, unspecified, uncomplicated: Secondary | ICD-10-CM | POA: Diagnosis not present

## 2015-12-15 DIAGNOSIS — C50912 Malignant neoplasm of unspecified site of left female breast: Secondary | ICD-10-CM | POA: Diagnosis present

## 2015-12-15 DIAGNOSIS — Z803 Family history of malignant neoplasm of breast: Secondary | ICD-10-CM | POA: Diagnosis not present

## 2015-12-15 DIAGNOSIS — I10 Essential (primary) hypertension: Secondary | ICD-10-CM | POA: Diagnosis not present

## 2015-12-15 DIAGNOSIS — Z923 Personal history of irradiation: Secondary | ICD-10-CM | POA: Diagnosis not present

## 2015-12-15 DIAGNOSIS — C50812 Malignant neoplasm of overlapping sites of left female breast: Secondary | ICD-10-CM | POA: Diagnosis not present

## 2015-12-15 DIAGNOSIS — Z79899 Other long term (current) drug therapy: Secondary | ICD-10-CM | POA: Diagnosis not present

## 2015-12-15 DIAGNOSIS — Z853 Personal history of malignant neoplasm of breast: Secondary | ICD-10-CM | POA: Diagnosis not present

## 2015-12-15 DIAGNOSIS — Z79811 Long term (current) use of aromatase inhibitors: Secondary | ICD-10-CM | POA: Diagnosis not present

## 2015-12-15 NOTE — Progress Notes (Signed)
Pt given 8 oz carton of boost breeze with verbal and written instructions (teach back) to drink at 630 morning of surgery and no other liquids after midnight.  Pt voiced understanding,     ekg reviewed by Dr Al Corpus and no further treatment required

## 2015-12-16 NOTE — H&P (Signed)
Sherry Rice Location: [facility] Patient #: [id number] DOB: [birth year] Separated / Language: Sherry Rice / Race: White Female       History of Present Illness  .The patient is a 70 year old female who presents with breast cancer. This is a 70 year old Caucasian female who returns for her set second visit to discuss management of her recurrent left breast cancer. Initially referred by Dr. Nolon Rice of the [facility]. Dr. London Rice is her PCP, Sherry Rice at [facility]. Her daughter is Sherry Bay, RN who works on [address] [employer]. She is present for the entire encounter.  Significant past history is left breast cancer in the upper outer quadrant 21 years ago, treated with lumpectomy and complete axillary lymph node dissection and adjuvant radiation therapy but no chemotherapy. She states that her nodes were negative. Recent mammograms show breast category densitity D, extremely dense . There was distortion of the left breast in the upper outer quadrant revealing a 1.5 cm spiculated mass at 2:00. There was also some density at the lumpectomy site of the 12 o'clock position. Physical exam showed a firm palpable area superior to the left nipple and around the lumpectomy site. ultrasound of the left axilla was negative.  Image guided biopsy of the left breast mass at 2:00 showed invasive duct carcinoma, ER 100%, PR 0, Ki-67 10%, HER-2 negative. Image guided biopsy of the left breast at the 12 o'clock position also showed an invasive mammary carcinoma, ER 90%, but her 30%, Ki-67 10%, HER-2 negative.  Comorbidities hypertension. Current tobacco abuse, history of left breast cancer Social history reveals she is divorced. Lives alone. Has 3 children. Is retired. He is hoping to find another soul-mate in her life. Family history is positive breast cancer in her mother who had a mastectomy and is a survivor, living at age 19. No other breast cancer or  ovarian or pancreatic cancer. Paternal grandmother had colon cancer.  She has seen Sherry Rice. He has discussed reconstructive techniques, unilateral and bilateral, latissimus flap. He has told her that she must stop smoking for 6 weeks before any reconstructive surgery. She has decided not to have reconstruction and wants a left total mastectomy and attempted sentinel lymph node biopsy.  She has seen Dr. Lindi Rice from medical oncology. He has requested Oncotype testing and may put her on antiestrogen, further decisions pending pathology. CT scans of the chest abdomen and pelvis were ordered by Dr. Lindi Rice. There is no obvious metastatic disease. There is no axillary adenopathy. There is no comment about her breast but she can see a rounded density in the left breast consistent with her cancer and the right breast just looks hyperdense and you cannot tell much about it. There is a question of a tiny mass in the uncinate process of the pancreas, significance unknown. There is borderline mesenteric lymphadenopathy of uncertain significance. Follow-up CT scan in 3-6 months is recommended. I discussed this issue with her and her daughter in detail. They understand that there is not much else to do about this at this time.  She has seen genetic counseling and has had blood drawn and it is 3 weeks from the results of that.  We talked about MRI which she canceled. I told her that it was still probably a good reason to do this because her breast are so extremely dense it is impossible to assess her right breast and she has recurrent breast cancer with a family history. I told her  this was not mandatory but recommended. She understands the sensitivity and selectivity issues and the possibility of a false positive scan. She accepts and agrees to the MRI.  Our current plan is to schedule bilateral breast MRI and left breast surgery concurrently. She knows that if the MRI identifies enhancement  on the right that she may require further biopsies and this may change the surgical plan and require rescheduling. She is comfortable with that.  She'll be scheduled for left total mastectomy and left axillary sentinel lymph node biopsy. She knows that we may not find a sentinel node due to the previous surgery. I discussed the indications, details, techniques, and numerous risk of the surgery with her. She is aware of the risk of bleeding, infection, skin necrosis, shoulder disability, arm swelling. She already has numbness under her arm. Seroma. Fluid collections, and other unforeseen problems. She understands all these issues. All of her questions are answered. She agrees with this plan.      Addendum Note Genetic testing negative  Addendum Note MRI of the breast shows that there is no MR evidence of malignancy in the right breast and there is no lymphadenopathy. On the left side, we do see multifocal biopsy-proven carcinoma with biopsy clips at 12 and 2 o'clock position. Some of this enhancement extends medially into the upper inner quadrant as well. There is also a separate, 8 mm suspicious enhancing mass in the central left breast posterior third which has not been biopsied. I called Sherry Rice and gave her the MRI results. I told her that nothing further needs to be done since she is having a mastectomy and the suspicious 8 mm mass will come out with the specimen and she understands all of that. She is ready for surgery this Friday.   Allergies  Codeine Sulfate *ANALGESICS - OPIOID*  Medication History  Losartan Potassium-HCTZ (100-25MG Tablet, Oral) Active. Cinnamon (500MG Capsule, 1,000 Oral) Active. Vitamin D3 (2000UNIT Capsule, Oral) Active. Vitamin C (1000MG Tablet, Oral) Active. Milk Thistle (300MG Capsule, Oral) Active. Anastrozole (1MG Tablet, Oral) Active. Medications Reconciled   VS: Weight: 124.2 lb Height: 65in Body Surface Area: 1.62  m Body Mass Index: 20.67 kg/m  Temp.: 97.92F(Oral)  Pulse: 66 (Regular)  BP: 132/80 (Sitting, Left Arm, Standard)       Physical Exam General Mental Status-Alert. General Appearance-Not in acute distress. Build & Nutrition-Well nourished. Posture-Normal posture. Gait-Normal. Note: BMI 20. Inferior appears very fit for her age. Alert and appropriate. Daughter is with her throughout the catheter.   Head and Neck Head-normocephalic, atraumatic with no lesions or palpable masses. Trachea-midline. Thyroid Gland Characteristics - normal size and consistency and no palpable nodules. Note: No cervical adenopathy   Chest and Lung Exam Chest and lung exam reveals -on auscultation, normal breath sounds, no adventitious sounds and normal vocal resonance.  Breast Note: breasts are medium size. Right breast reveals no skin abnormality, palpable mass, or axillary adenopathy. Left breast reveals transverse scar upper outer quadrant and left axillary scar. There is a palpable thickening of the 12 o'clock position of the left breast some ecchymoses but no hematoma. No axillary mass. There is no arm swelling. There actually is hypesthesia of the upper inner left arm. She has full range of motion.   Cardiovascular Cardiovascular examination reveals -normal heart sounds, regular rate and rhythm with no murmurs and femoral artery auscultation bilaterally reveals normal pulses, no bruits, no thrills.  Abdomen Inspection Inspection of the abdomen reveals - No Hernias. Palpation/Percussion Palpation and Percussion  of the abdomen reveal - Soft, Non Tender, No Rigidity (guarding), No hepatosplenomegaly and No Palpable abdominal masses.  Neurologic Neurologic evaluation reveals -alert and oriented x 3 with no impairment of recent or remote memory, normal attention span and ability to concentrate, normal sensation and normal coordination.  Musculoskeletal Normal  Exam - Bilateral-Upper Extremity Strength Normal and Lower Extremity Strength Normal.    Assessment & Plan  RECURRENT BREAST CANCER, LEFT (C50.912)   You have seen Dr. Lindi Rice who discussed possible adjuvant therapies including antiestrogen pills and chemotherapy. He discussed Oncotype testing with you. We have discussed the CT scan which does show the left breast cancer. The right breast is difficult to interpret due to density. It does show some slightly enlarged intestinal lymph nodes and a tiny spot in the back of your pancreas. CT scan should be repeated in 6 months. Dr. Lindi Rice will arrange that.  You have seen Sherry Rice who has discussed unilateral and bilateral breast reconstruction. He has stated that you must stop smoking for 6 weeks before surgery. You have decided to decline immediate reconstruction at this time.  You have completed genetic counseling and blood work has been drawn for genetic testing and that will take 3 weeks.  The plan that we decided on is as follows: You will be scheduled for left total mastectomy and attempted left axillary sentinel lymph node biopsy You will also be scheduled for bilateral breast MRI and that must be done before your mastectomy, in case it would alter our plan as we discussed We discussed the indications, techniques, and numerous risk of the surgery with you and your daughter. We will try to schedule this in the near future.  FAMILY HISTORY OF BREAST CANCER IN FEMALE (Z80.3) TOBACCO USER (Z72.0) HYPERTENSION, BENIGN (I10)   Marcina Kinnison M. Dalbert Batman, M.D., [facility] General and Minimally invasive Surgery Breast and Colorectal Surgery Office:   [phone] Pager:   [phone]

## 2015-12-17 ENCOUNTER — Telehealth: Payer: Self-pay | Admitting: Hematology and Oncology

## 2015-12-17 NOTE — Telephone Encounter (Signed)
8/25 Appointment rescheduled to 8/28 per patient request due to availability.

## 2015-12-17 NOTE — Anesthesia Preprocedure Evaluation (Addendum)
Anesthesia Evaluation  Patient identified by MRN, date of birth, ID band Patient awake    Reviewed: Allergy & Precautions, NPO status , Patient's Chart, lab work & pertinent test results  Airway Mallampati: I  TM Distance: >3 FB Neck ROM: Full    Dental no notable dental hx. (+) Teeth Intact, Dental Advisory Given   Pulmonary neg pulmonary ROS, Current Smoker,    Pulmonary exam normal breath sounds clear to auscultation       Cardiovascular hypertension, Pt. on medications negative cardio ROS Normal cardiovascular exam Rhythm:Regular Rate:Normal     Neuro/Psych negative neurological ROS  negative psych ROS   GI/Hepatic negative GI ROS, Neg liver ROS,   Endo/Other  negative endocrine ROS  Renal/GU negative Renal ROS  negative genitourinary   Musculoskeletal negative musculoskeletal ROS (+)   Abdominal   Peds negative pediatric ROS (+)  Hematology negative hematology ROS (+)   Anesthesia Other Findings   Reproductive/Obstetrics negative OB ROS                            Anesthesia Physical Anesthesia Plan  ASA: II  Anesthesia Plan: General   Post-op Pain Management: GA combined w/ Regional for post-op pain   Induction: Intravenous  Airway Management Planned: LMA  Additional Equipment:   Intra-op Plan:   Post-operative Plan: Extubation in OR  Informed Consent: I have reviewed the patients History and Physical, chart, labs and discussed the procedure including the risks, benefits and alternatives for the proposed anesthesia with the patient or authorized representative who has indicated his/her understanding and acceptance.   Dental advisory given  Plan Discussed with: CRNA, Anesthesiologist and Surgeon  Anesthesia Plan Comments:        Anesthesia Quick Evaluation

## 2015-12-18 ENCOUNTER — Encounter (HOSPITAL_COMMUNITY)
Admission: RE | Admit: 2015-12-18 | Discharge: 2015-12-18 | Disposition: A | Discharge status: Home / Self Care | Payer: Medicare Other | Source: Ambulatory Visit | Attending: General Surgery | Admitting: General Surgery

## 2015-12-18 ENCOUNTER — Encounter (HOSPITAL_BASED_OUTPATIENT_CLINIC_OR_DEPARTMENT_OTHER)
Admission: RE | Disposition: A | Discharge status: Home / Self Care | Payer: Self-pay | Source: Ambulatory Visit | Attending: General Surgery

## 2015-12-18 ENCOUNTER — Ambulatory Visit (HOSPITAL_BASED_OUTPATIENT_CLINIC_OR_DEPARTMENT_OTHER): Payer: Medicare Other | Admitting: Anesthesiology

## 2015-12-18 ENCOUNTER — Ambulatory Visit (HOSPITAL_BASED_OUTPATIENT_CLINIC_OR_DEPARTMENT_OTHER)
Admission: RE | Admit: 2015-12-18 | Discharge: 2015-12-19 | Disposition: A | Discharge status: Home / Self Care | Payer: Medicare Other | Source: Ambulatory Visit | Attending: General Surgery | Admitting: General Surgery

## 2015-12-18 ENCOUNTER — Encounter (HOSPITAL_BASED_OUTPATIENT_CLINIC_OR_DEPARTMENT_OTHER): Payer: Self-pay | Admitting: *Deleted

## 2015-12-18 DIAGNOSIS — Z853 Personal history of malignant neoplasm of breast: Secondary | ICD-10-CM | POA: Insufficient documentation

## 2015-12-18 DIAGNOSIS — Z79899 Other long term (current) drug therapy: Secondary | ICD-10-CM | POA: Insufficient documentation

## 2015-12-18 DIAGNOSIS — Z79811 Long term (current) use of aromatase inhibitors: Secondary | ICD-10-CM | POA: Insufficient documentation

## 2015-12-18 DIAGNOSIS — F17200 Nicotine dependence, unspecified, uncomplicated: Secondary | ICD-10-CM | POA: Diagnosis present

## 2015-12-18 DIAGNOSIS — D0582 Other specified type of carcinoma in situ of left breast: Secondary | ICD-10-CM | POA: Diagnosis not present

## 2015-12-18 DIAGNOSIS — I10 Essential (primary) hypertension: Secondary | ICD-10-CM | POA: Diagnosis present

## 2015-12-18 DIAGNOSIS — C50412 Malignant neoplasm of upper-outer quadrant of left female breast: Secondary | ICD-10-CM | POA: Diagnosis present

## 2015-12-18 DIAGNOSIS — C50812 Malignant neoplasm of overlapping sites of left female breast: Secondary | ICD-10-CM | POA: Diagnosis not present

## 2015-12-18 DIAGNOSIS — Z923 Personal history of irradiation: Secondary | ICD-10-CM | POA: Insufficient documentation

## 2015-12-18 DIAGNOSIS — C50112 Malignant neoplasm of central portion of left female breast: Secondary | ICD-10-CM

## 2015-12-18 DIAGNOSIS — Z170 Estrogen receptor positive status [ER+]: Secondary | ICD-10-CM | POA: Diagnosis not present

## 2015-12-18 DIAGNOSIS — Z803 Family history of malignant neoplasm of breast: Secondary | ICD-10-CM | POA: Insufficient documentation

## 2015-12-18 HISTORY — DX: Malignant neoplasm of unspecified site of unspecified female breast: C50.919

## 2015-12-18 HISTORY — PX: TOTAL MASTECTOMY: SHX6129

## 2015-12-18 SURGERY — MASTECTOMY, SIMPLE
Anesthesia: General | Site: Breast | Laterality: Left

## 2015-12-18 MED ORDER — LACTATED RINGERS IV SOLN
INTRAVENOUS | Status: DC
  Administered 2015-12-18: 11:00:00 via INTRAVENOUS

## 2015-12-18 MED ORDER — MEPERIDINE HCL 25 MG/ML IJ SOLN: 6.2500 mg | INTRAMUSCULAR | Status: DC | PRN

## 2015-12-18 MED ORDER — LIDOCAINE 2% (20 MG/ML) 5 ML SYRINGE
INTRAMUSCULAR | Status: DC | PRN
  Administered 2015-12-18: 60 mg via INTRAVENOUS

## 2015-12-18 MED ORDER — FENTANYL CITRATE (PF) 100 MCG/2ML IJ SOLN
INTRAMUSCULAR | Status: AC
  Filled 2015-12-18: qty 2

## 2015-12-18 MED ORDER — ENOXAPARIN SODIUM 40 MG/0.4ML ~~LOC~~ SOLN: 40.0000 mg | SUBCUTANEOUS | Status: DC

## 2015-12-18 MED ORDER — ONDANSETRON HCL 4 MG/2ML IJ SOLN
INTRAMUSCULAR | Status: DC | PRN
  Administered 2015-12-18: 4 mg via INTRAVENOUS

## 2015-12-18 MED ORDER — BUPIVACAINE-EPINEPHRINE (PF) 0.5% -1:200000 IJ SOLN
INTRAMUSCULAR | Status: DC | PRN
  Administered 2015-12-18: 25 mL via PERINEURAL

## 2015-12-18 MED ORDER — LOSARTAN POTASSIUM-HCTZ 100-25 MG PO TABS
1.0000 | ORAL_TABLET | Freq: Every day | ORAL | Status: DC
  Administered 2015-12-18: 1 via ORAL

## 2015-12-18 MED ORDER — CHLORHEXIDINE GLUCONATE CLOTH 2 % EX PADS: 6.0000 | MEDICATED_PAD | Freq: Once | CUTANEOUS | Status: DC

## 2015-12-18 MED ORDER — VITAMIN D3 25 MCG (1000 UNIT) PO TABS: 1000.0000 [IU] | ORAL_TABLET | Freq: Every day | ORAL | Status: DC

## 2015-12-18 MED ORDER — ONDANSETRON HCL 4 MG/2ML IJ SOLN: 4.0000 mg | Freq: Once | INTRAMUSCULAR | Status: DC | PRN

## 2015-12-18 MED ORDER — PROPOFOL 500 MG/50ML IV EMUL
INTRAVENOUS | Status: AC
  Filled 2015-12-18: qty 50

## 2015-12-18 MED ORDER — FENTANYL CITRATE (PF) 100 MCG/2ML IJ SOLN
50.0000 ug | INTRAMUSCULAR | Status: AC | PRN
  Administered 2015-12-18: 50 ug via INTRAVENOUS
  Administered 2015-12-18: 25 ug via INTRAVENOUS
  Administered 2015-12-18: 50 ug via INTRAVENOUS
  Administered 2015-12-18: 25 ug via INTRAVENOUS
  Administered 2015-12-18: 100 ug via INTRAVENOUS

## 2015-12-18 MED ORDER — SENNA 8.6 MG PO TABS
1.0000 | ORAL_TABLET | Freq: Two times a day (BID) | ORAL | Status: DC
Start: 2015-12-18 — End: 2015-12-19
  Administered 2015-12-18 (×2): 8.6 mg via ORAL
  Filled 2015-12-18 (×2): qty 1

## 2015-12-18 MED ORDER — MIDAZOLAM HCL 2 MG/2ML IJ SOLN
INTRAMUSCULAR | Status: AC
  Filled 2015-12-18: qty 2

## 2015-12-18 MED ORDER — CEFAZOLIN SODIUM-DEXTROSE 2-4 GM/100ML-% IV SOLN
INTRAVENOUS | Status: AC
  Filled 2015-12-18: qty 100

## 2015-12-18 MED ORDER — HYDROMORPHONE HCL 1 MG/ML IJ SOLN
1.0000 mg | INTRAMUSCULAR | Status: DC | PRN
  Administered 2015-12-18 – 2015-12-19 (×3): 1 mg via INTRAVENOUS
  Filled 2015-12-18 (×3): qty 1

## 2015-12-18 MED ORDER — HYDROMORPHONE HCL 1 MG/ML IJ SOLN
0.2500 mg | INTRAMUSCULAR | Status: DC | PRN
  Administered 2015-12-18: 0.25 mg via INTRAVENOUS
  Administered 2015-12-18: 0.5 mg via INTRAVENOUS

## 2015-12-18 MED ORDER — GABAPENTIN 300 MG PO CAPS
ORAL_CAPSULE | ORAL | Status: AC
  Filled 2015-12-18: qty 1

## 2015-12-18 MED ORDER — VITAMIN C 500 MG PO TABS: 500.0000 mg | ORAL_TABLET | Freq: Every day | ORAL | Status: DC

## 2015-12-18 MED ORDER — TECHNETIUM TC 99M SULFUR COLLOID FILTERED
1.0000 | Freq: Once | INTRAVENOUS | Status: AC | PRN
  Administered 2015-12-18: 1 via INTRADERMAL

## 2015-12-18 MED ORDER — EPHEDRINE SULFATE 50 MG/ML IJ SOLN
INTRAMUSCULAR | Status: DC | PRN
  Administered 2015-12-18: 10 mg via INTRAVENOUS

## 2015-12-18 MED ORDER — ACETAMINOPHEN 500 MG PO TABS
1000.0000 mg | ORAL_TABLET | ORAL | Status: AC
  Administered 2015-12-18: 1000 mg via ORAL

## 2015-12-18 MED ORDER — GABAPENTIN 300 MG PO CAPS
300.0000 mg | ORAL_CAPSULE | ORAL | Status: AC
  Administered 2015-12-18: 300 mg via ORAL

## 2015-12-18 MED ORDER — ANASTROZOLE 1 MG PO TABS: 1.0000 mg | ORAL_TABLET | Freq: Every day | ORAL | Status: DC

## 2015-12-18 MED ORDER — ONDANSETRON HCL 4 MG/2ML IJ SOLN: 4.0000 mg | Freq: Four times a day (QID) | INTRAMUSCULAR | Status: DC | PRN

## 2015-12-18 MED ORDER — HYDROMORPHONE HCL 1 MG/ML IJ SOLN: 0.2500 mg | INTRAMUSCULAR | Status: DC | PRN

## 2015-12-18 MED ORDER — DEXAMETHASONE SODIUM PHOSPHATE 10 MG/ML IJ SOLN
INTRAMUSCULAR | Status: AC
  Filled 2015-12-18: qty 1

## 2015-12-18 MED ORDER — ONDANSETRON HCL 4 MG/2ML IJ SOLN
INTRAMUSCULAR | Status: AC
  Filled 2015-12-18: qty 2

## 2015-12-18 MED ORDER — DEXAMETHASONE SODIUM PHOSPHATE 4 MG/ML IJ SOLN
INTRAMUSCULAR | Status: DC | PRN
  Administered 2015-12-18: 10 mg via INTRAVENOUS

## 2015-12-18 MED ORDER — ACETAMINOPHEN 500 MG PO TABS
ORAL_TABLET | ORAL | Status: AC
  Filled 2015-12-18: qty 2

## 2015-12-18 MED ORDER — OXYCODONE HCL 5 MG PO TABS: 5.0000 mg | ORAL_TABLET | Freq: Once | ORAL | Status: DC | PRN

## 2015-12-18 MED ORDER — PROMETHAZINE HCL 25 MG/ML IJ SOLN
6.2500 mg | INTRAMUSCULAR | Status: DC | PRN
Start: 2015-12-18 — End: 2015-12-19

## 2015-12-18 MED ORDER — MIDAZOLAM HCL 2 MG/2ML IJ SOLN
1.0000 mg | INTRAMUSCULAR | Status: DC | PRN
  Administered 2015-12-18: 1 mg via INTRAVENOUS

## 2015-12-18 MED ORDER — GLYCOPYRROLATE 0.2 MG/ML IJ SOLN: 0.2000 mg | Freq: Once | INTRAMUSCULAR | Status: DC | PRN

## 2015-12-18 MED ORDER — GABAPENTIN 300 MG PO CAPS: 300.0000 mg | ORAL_CAPSULE | ORAL | Status: DC

## 2015-12-18 MED ORDER — 0.9 % SODIUM CHLORIDE (POUR BTL) OPTIME
TOPICAL | Status: DC | PRN
  Administered 2015-12-18: 2000 mL

## 2015-12-18 MED ORDER — CEFAZOLIN SODIUM-DEXTROSE 2-4 GM/100ML-% IV SOLN: 2.0000 g | INTRAVENOUS | Status: DC

## 2015-12-18 MED ORDER — SCOPOLAMINE 1 MG/3DAYS TD PT72: 1.0000 | MEDICATED_PATCH | Freq: Once | TRANSDERMAL | Status: DC | PRN

## 2015-12-18 MED ORDER — HYDROMORPHONE HCL 1 MG/ML IJ SOLN
INTRAMUSCULAR | Status: AC
  Filled 2015-12-18: qty 1

## 2015-12-18 MED ORDER — METHOCARBAMOL 500 MG PO TABS
500.0000 mg | ORAL_TABLET | Freq: Four times a day (QID) | ORAL | Status: DC | PRN
  Administered 2015-12-18 (×2): 500 mg via ORAL
  Filled 2015-12-18 (×2): qty 1

## 2015-12-18 MED ORDER — EPHEDRINE SULFATE 50 MG/ML IJ SOLN
INTRAMUSCULAR | Status: AC
  Filled 2015-12-18: qty 1

## 2015-12-18 MED ORDER — LACTATED RINGERS IV SOLN
INTRAVENOUS | Status: DC
  Administered 2015-12-18: 07:00:00 via INTRAVENOUS

## 2015-12-18 MED ORDER — PROPOFOL 10 MG/ML IV BOLUS
INTRAVENOUS | Status: DC | PRN
  Administered 2015-12-18: 200 mg via INTRAVENOUS

## 2015-12-18 MED ORDER — CEFAZOLIN SODIUM-DEXTROSE 2-4 GM/100ML-% IV SOLN
2.0000 g | INTRAVENOUS | Status: AC
  Administered 2015-12-18: 2 g via INTRAVENOUS

## 2015-12-18 MED ORDER — CEFAZOLIN SODIUM-DEXTROSE 2-4 GM/100ML-% IV SOLN
2.0000 g | Freq: Three times a day (TID) | INTRAVENOUS | Status: AC
  Administered 2015-12-18: 2 g via INTRAVENOUS
  Filled 2015-12-18: qty 100

## 2015-12-18 MED ORDER — HYDROCODONE-ACETAMINOPHEN 5-325 MG PO TABS
1.0000 | ORAL_TABLET | ORAL | Status: DC | PRN
  Administered 2015-12-18: 2 via ORAL
  Administered 2015-12-18: 1 via ORAL
  Administered 2015-12-18 – 2015-12-19 (×3): 2 via ORAL
  Filled 2015-12-18 (×4): qty 2
  Filled 2015-12-18: qty 1

## 2015-12-18 MED ORDER — SODIUM CHLORIDE 0.9 % IJ SOLN
INTRAVENOUS | Status: DC | PRN
  Administered 2015-12-18: 5 mL via INTRAMUSCULAR

## 2015-12-18 MED ORDER — ONDANSETRON 4 MG PO TBDP: 4.0000 mg | ORAL_TABLET | Freq: Four times a day (QID) | ORAL | Status: DC | PRN

## 2015-12-18 MED ORDER — ACETAMINOPHEN 500 MG PO TABS: 1000.0000 mg | ORAL_TABLET | ORAL | Status: DC

## 2015-12-18 MED ORDER — OXYCODONE HCL 5 MG/5ML PO SOLN: 5.0000 mg | Freq: Once | ORAL | Status: DC | PRN

## 2015-12-18 MED ORDER — LIDOCAINE 2% (20 MG/ML) 5 ML SYRINGE
INTRAMUSCULAR | Status: AC
  Filled 2015-12-18: qty 5

## 2015-12-18 SURGICAL SUPPLY — 67 items
APPLIER CLIP 11 MED OPEN (CLIP) ×2
BANDAGE ACE 6X5 VEL STRL LF (GAUZE/BANDAGES/DRESSINGS) IMPLANT
BINDER BREAST LRG (GAUZE/BANDAGES/DRESSINGS) ×2 IMPLANT
BINDER BREAST XLRG (GAUZE/BANDAGES/DRESSINGS) IMPLANT
BIOPATCH RED 1 DISK 7.0 (GAUZE/BANDAGES/DRESSINGS) ×4 IMPLANT
BLADE CLIPPER SURG (BLADE) IMPLANT
BLADE HEX COATED 2.75 (ELECTRODE) ×2 IMPLANT
BLADE SURG 10 STRL SS (BLADE) ×2 IMPLANT
BLADE SURG 15 STRL LF DISP TIS (BLADE) ×1 IMPLANT
BLADE SURG 15 STRL SS (BLADE) ×1
CANISTER SUCT 1200ML W/VALVE (MISCELLANEOUS) ×2 IMPLANT
CHLORAPREP W/TINT 26ML (MISCELLANEOUS) ×2 IMPLANT
CLIP APPLIE 11 MED OPEN (CLIP) ×1 IMPLANT
COVER BACK TABLE 60X90IN (DRAPES) ×2 IMPLANT
COVER MAYO STAND STRL (DRAPES) ×2 IMPLANT
COVER PROBE W GEL 5X96 (DRAPES) ×2 IMPLANT
DECANTER SPIKE VIAL GLASS SM (MISCELLANEOUS) IMPLANT
DERMABOND ADVANCED (GAUZE/BANDAGES/DRESSINGS) ×1
DERMABOND ADVANCED .7 DNX12 (GAUZE/BANDAGES/DRESSINGS) ×1 IMPLANT
DRAIN CHANNEL 19F RND (DRAIN) ×4 IMPLANT
DRAPE LAPAROSCOPIC ABDOMINAL (DRAPES) ×2 IMPLANT
DRAPE UTILITY XL STRL (DRAPES) ×2 IMPLANT
DRSG EMULSION OIL 3X3 NADH (GAUZE/BANDAGES/DRESSINGS) IMPLANT
DRSG PAD ABDOMINAL 8X10 ST (GAUZE/BANDAGES/DRESSINGS) ×2 IMPLANT
ELECT BLADE 4.0 EZ CLEAN MEGAD (MISCELLANEOUS) ×2
ELECT REM PT RETURN 9FT ADLT (ELECTROSURGICAL) ×2
ELECTRODE BLDE 4.0 EZ CLN MEGD (MISCELLANEOUS) ×1 IMPLANT
ELECTRODE REM PT RTRN 9FT ADLT (ELECTROSURGICAL) ×1 IMPLANT
EVACUATOR SILICONE 100CC (DRAIN) ×4 IMPLANT
GAUZE SPONGE 4X4 12PLY STRL (GAUZE/BANDAGES/DRESSINGS) ×2 IMPLANT
GLOVE BIO SURGEON STRL SZ7 (GLOVE) ×4 IMPLANT
GLOVE BIO SURGEON STRL SZ7.5 (GLOVE) ×2 IMPLANT
GLOVE BIOGEL PI IND STRL 7.0 (GLOVE) ×1 IMPLANT
GLOVE BIOGEL PI IND STRL 8 (GLOVE) ×1 IMPLANT
GLOVE BIOGEL PI INDICATOR 7.0 (GLOVE) ×1
GLOVE BIOGEL PI INDICATOR 8 (GLOVE) ×1
GLOVE EUDERMIC 7 POWDERFREE (GLOVE) ×2 IMPLANT
GOWN STRL REUS W/ TWL LRG LVL3 (GOWN DISPOSABLE) ×1 IMPLANT
GOWN STRL REUS W/ TWL XL LVL3 (GOWN DISPOSABLE) ×2 IMPLANT
GOWN STRL REUS W/TWL LRG LVL3 (GOWN DISPOSABLE) ×1
GOWN STRL REUS W/TWL XL LVL3 (GOWN DISPOSABLE) ×4
ILLUMINATOR WAVEGUIDE N/F (MISCELLANEOUS) IMPLANT
LIGHT WAVEGUIDE WIDE FLAT (MISCELLANEOUS) IMPLANT
NDL SAFETY ECLIPSE 18X1.5 (NEEDLE) ×1 IMPLANT
NEEDLE HYPO 18GX1.5 SHARP (NEEDLE) ×1
NEEDLE HYPO 25X1 1.5 SAFETY (NEEDLE) ×2 IMPLANT
NS IRRIG 1000ML POUR BTL (IV SOLUTION) ×2 IMPLANT
PACK BASIN DAY SURGERY FS (CUSTOM PROCEDURE TRAY) ×2 IMPLANT
PAD ALCOHOL SWAB (MISCELLANEOUS) ×4 IMPLANT
PENCIL BUTTON HOLSTER BLD 10FT (ELECTRODE) ×2 IMPLANT
SHEET MEDIUM DRAPE 40X70 STRL (DRAPES) ×2 IMPLANT
SLEEVE SCD COMPRESS KNEE MED (MISCELLANEOUS) ×2 IMPLANT
SPONGE GAUZE 4X4 12PLY STER LF (GAUZE/BANDAGES/DRESSINGS) ×2 IMPLANT
SPONGE LAP 18X18 X RAY DECT (DISPOSABLE) ×4 IMPLANT
SPONGE LAP 4X18 X RAY DECT (DISPOSABLE) ×2 IMPLANT
STAPLER VISISTAT 35W (STAPLE) IMPLANT
SUT ETHILON 3 0 PS 1 (SUTURE) ×4 IMPLANT
SUT MNCRL AB 4-0 PS2 18 (SUTURE) ×4 IMPLANT
SUT SILK 2 0 FS (SUTURE) ×2 IMPLANT
SUT VICRYL 3-0 CR8 SH (SUTURE) ×2 IMPLANT
SYRINGE 10CC LL (SYRINGE) ×4 IMPLANT
TAPE CLOTH SURG 4X10 WHT LF (GAUZE/BANDAGES/DRESSINGS) IMPLANT
TOWEL OR 17X24 6PK STRL BLUE (TOWEL DISPOSABLE) ×2 IMPLANT
TOWEL OR NON WOVEN STRL DISP B (DISPOSABLE) ×2 IMPLANT
TRAY DSU PREP LF (CUSTOM PROCEDURE TRAY) IMPLANT
TUBE CONNECTING 20X1/4 (TUBING) ×2 IMPLANT
YANKAUER SUCT BULB TIP NO VENT (SUCTIONS) ×2 IMPLANT

## 2015-12-18 NOTE — Transfer of Care (Signed)
Immediate Anesthesia Transfer of Care Note  Patient: Sherry Rice  Procedure(s) Performed: Procedure(s): LEFT TOTAL MASTECTOMY (Left)  Patient Location: PACU  Anesthesia Type:General  Level of Consciousness: awake and sedated  Airway & Oxygen Therapy: Patient Spontanous Breathing and Patient connected to face mask oxygen  Post-op Assessment: Report given to RN and Post -op Vital signs reviewed and stable  Post vital signs: Reviewed and stable  Last Vitals:  Vitals:   12/18/15 0720 12/18/15 0725  BP: 135/86 132/84  Pulse: 74 81  Resp: (!) 22 14  Temp:      Last Pain:  Vitals:   12/18/15 0649  TempSrc: Oral         Complications: No apparent anesthesia complications

## 2015-12-18 NOTE — Anesthesia Procedure Notes (Signed)
Procedure Name: LMA Insertion Performed by: Miangel Flom W Pre-anesthesia Checklist: Patient identified, Emergency Drugs available, Suction available and Patient being monitored Patient Re-evaluated:Patient Re-evaluated prior to inductionOxygen Delivery Method: Circle system utilized Preoxygenation: Pre-oxygenation with 100% oxygen Intubation Type: IV induction Ventilation: Mask ventilation without difficulty LMA: LMA inserted LMA Size: 4.0 Number of attempts: 1 Placement Confirmation: positive ETCO2 Tube secured with: Tape Dental Injury: Teeth and Oropharynx as per pre-operative assessment        

## 2015-12-18 NOTE — Anesthesia Procedure Notes (Addendum)
Anesthesia Regional Block:  Pectoralis block  Pre-Anesthetic Checklist: ,, timeout performed, Correct Patient, Correct Site, Correct Laterality, Correct Procedure, Correct Position, site marked, Risks and benefits discussed,  Surgical consent,  Pre-op evaluation,  At surgeon's request and post-op pain management  Laterality: Left and Upper  Prep: chloraprep       Needles:  Injection technique: Single-shot  Needle Type: Echogenic Needle     Needle Length: 9cm 9 cm Needle Gauge: 21 and 21 G    Additional Needles:  Procedures: ultrasound guided (picture in chart) Pectoralis block Narrative:  Start time: 12/18/2015 7:11 AM End time: 12/18/2015 7:16 AM Injection made incrementally with aspirations every 5 mL.  Performed by: Personally  Anesthesiologist: Ridhima Golberg       Left PEC block image

## 2015-12-18 NOTE — Anesthesia Postprocedure Evaluation (Signed)
Anesthesia Post Note  Patient: Sherry Rice  Procedure(s) Performed: Procedure(s) (LRB): LEFT TOTAL MASTECTOMY (Left)  Patient location during evaluation: PACU Anesthesia Type: General and Regional Level of consciousness: awake Pain management: pain level controlled Vital Signs Assessment: post-procedure vital signs reviewed and stable Respiratory status: spontaneous breathing Cardiovascular status: stable Postop Assessment: no signs of nausea or vomiting Anesthetic complications: no    Last Vitals:  Vitals:   12/18/15 1000 12/18/15 1015  BP: (!) 133/91 134/86  Pulse: 68 69  Resp: 11 18  Temp:      Last Pain:  Vitals:   12/18/15 1015  TempSrc:   PainSc: 2                  Lester Crickenberger

## 2015-12-18 NOTE — Op Note (Signed)
Patient Name:           Sherry Rice   Date of Surgery:        12/18/2015  Pre op Diagnosis:      Recurrent invasive ductal carcinoma left breast, overlapping sites, receptor positive, HER-2 negative  Post op Diagnosis:    Same  Procedure:                 Inject blue dye left breast, left total mastectomy, attempted sentinel lymph node biopsy  Surgeon:                     Edsel Petrin. Dalbert Batman, M.D., FACS  Assistant:                      OR staff  Operative Indications:   .Marland Kitchen This is a 70 year old Caucasian female who returns for her  second visit to discuss management of her recurrent left breast cancer. Initially referred by Dr. Nolon Nations of the [facility]. Dr. London Pepper is her PCP, Sadie Haber at [facility]. .      Significant past history is left breast cancer in the upper outer quadrant 21 years ago, treated with lumpectomy and complete axillary lymph node dissection and adjuvant radiation therapy but no chemotherapy. She states that her nodes were negative. Recent mammograms show breast category densitity D, extremely dense . There was distortion of the left breast in the upper outer quadrant revealing a 1.5 cm spiculated mass at 2:00. There was also some density at the lumpectomy site of the 12 o'clock position. Physical exam showed a firm palpable area superior to the left nipple and around the lumpectomy site. ultrasound of the left axilla was negative.      Image guided biopsy of the left breast mass at 2:00 showed invasive duct carcinoma, ER 100%, PR 0, Ki-67 10%, HER-2 negative. Image guided biopsy of the left breast at the 12 o'clock position also showed an invasive mammary carcinoma, ER 90%, but her 30%, Ki-67 10%, HER-2 negative.      Comorbidities hypertension. Current tobacco abuse, history of left breast cancer  Family history is positive for breast cancer in her mother had a mastectomy and is a survivor living at age 60.  No other breast cancer  ovarian or pancreatic cancer.  Paternal grandmother had colon cancer.      She has seen Dr. Crissie Reese. He has discussed reconstructive techniques, unilateral and bilateral, latissimus flap. He has told her that she must stop smoking for 6 weeks before any reconstructive surgery. She has decided not to have reconstruction and wants a left total mastectomy and attempted sentinel lymph node biopsy.     She has seen Dr. Lindi Adie from medical oncology. He has requested Oncotype testing and may put her on antiestrogen, further decisions pending pathology. CT scans of the chest abdomen and pelvis were ordered by Dr. Lindi Adie. There is no obvious metastatic disease. There is no axillary adenopathy.There is a question of a tiny mass in the uncinate process of the pancreas, significance unknown. There is borderline mesenteric lymphadenopathy of uncertain significance. Follow-up CT scan in 3-6 months is recommended. I discussed this issue with her and her daughter in detail. They understand that there is not much else to do about this at this time.   She has seen genetic counseling and has had blood drawn and those results are pending.    MRI showed no evidence of malignancy in  the right breast or adenopathy.    She is brought to the operating room electively for left total mastectomy and attempted left axilla sentinel node biopsy  Operative Findings:       The tumor is palpable in the left breast mostly in the upper half.  There was no evidence of skin invasion.  There was no evidence of muscle invasion.  The lower skin flap was somewhat discolored preop and remained somewhat discolored postop.  This is felt to be due to chronic radiation changes.  She had reasonable capillary refill but because she is a smoker there is a slight concern here.  There were no lymph nodes in the left axilla.  Specifically there was no palpable mass, no blue dye, no radioactivity.  No visible lymph nodes.  Significant  attempt was made to find sentinel node but there were none.  Procedure in Detail:          A left pectoral block was performed in the holding area.  Nuclear medicine injected technetium 99 into the left breast in the holding area.  The patient was taken to the operating room and underwent general endotracheal anesthesia.  Surgical timeout was performed.  Intravenous antibiotics were given.  Following alcohol prep the left breast was injected with 5 mL of dilute methylene blue and massaged for a few minutes.     Using a marking pin I designed and  drew a transverse elliptical incision, taking the incision as high as possible of the above the areola.  The incision was made.  Skin flaps were raised superiorly to the infraclavicular area, medially to the parasternal area, inferiorly to the inframammary crease and anterior rectus sheath and laterally to the anterior border of the latissimus dorsi muscle.  The lateral skin was marked with silk suture.  The entire breast was then dissected off of the pectoralis major and minor muscles and the specimen was sent to the lab.  I spent at least 10 minutes examining the left axilla.  There is no palpable mass.  There was no visible blue dye.  There was no radioactivity.  I cannot see any lymph nodes.  I see a few clips from the prior axillary dissection.  The sentinel lymph node biopsy was then abandoned.  The wound was irrigated with saline.  The hemostasis was excellent.  Two 47 French Blake drains were placed, one up into the axillary area and one across the skin flaps.  These were brought out through separate stab incisions inferolaterally, sutured to the scan and  connected to suction bulbs.  The wound was closed in 2 layers.  Subcutaneous tissue with multiple interrupted sutures of 3-0 Vicryl and the skin with a running subcuticular 4-0 Monocryl and Dermabond.  Dry bandages and a breast binder were placed.  The patient tolerated the procedure well was taken to PACU in  stable condition.  EBL 50 mL or less.  Counts correct.  Complications none.     Edsel Petrin. Dalbert Batman, M.D., FACS General and Minimally Invasive Surgery Breast and Colorectal Surgery  12/18/2015 9:18 AM

## 2015-12-18 NOTE — Interval H&P Note (Signed)
History and Physical Interval Note:  12/18/2015 6:18 AM  Sherry Rice  has presented today for surgery, with the diagnosis of RECURRENT LEFT BREAST CANCER  The various methods of treatment have been discussed with the patient and family. After consideration of risks, benefits and other options for treatment, the patient has consented to  Procedure(s): LEFT TOTAL MASTECTOMY WITH LEFT SENTINEL LYMPH NODE BIOPSY (Left) as a surgical intervention .  The patient's history has been reviewed, patient examined, no change in status, stable for surgery.  I have reviewed the patient's chart and labs.  Questions were answered to the patient's satisfaction.     Adin Hector

## 2015-12-18 NOTE — Progress Notes (Signed)
Assisted Dr. Crews with left, ultrasound guided, pectoralis block. Side rails up, monitors on throughout procedure. See vital signs in flow sheet. Tolerated Procedure well. 

## 2015-12-19 DIAGNOSIS — C50812 Malignant neoplasm of overlapping sites of left female breast: Secondary | ICD-10-CM | POA: Diagnosis not present

## 2015-12-19 MED ORDER — HYDROCODONE-ACETAMINOPHEN 5-325 MG PO TABS: 1.0000 | ORAL_TABLET | ORAL | 0 refills | Status: DC | PRN

## 2015-12-19 MED ORDER — METHOCARBAMOL 500 MG PO TABS: 500.0000 mg | ORAL_TABLET | Freq: Four times a day (QID) | ORAL | 1 refills | Status: DC | PRN

## 2015-12-19 NOTE — Discharge Instructions (Signed)
[facility] [place] °[phone] ° °MASTECTOMY: POST OP INSTRUCTIONS ° °Always review your discharge instruction sheet given to you by the facility where your surgery was performed. °IF YOU HAVE DISABILITY OR FAMILY LEAVE FORMS, YOU MUST BRING THEM TO THE OFFICE FOR PROCESSING.   °DO NOT GIVE THEM TO YOUR DOCTOR. °A prescription for pain medication may be given to you upon discharge.  Take your pain medication as prescribed, if needed.  If narcotic pain medicine is not needed, then you may take acetaminophen (Tylenol) or ibuprofen (Advil) as needed. °1. Take your usually prescribed medications unless otherwise directed. °2. If you need a refill on your pain medication, please contact your pharmacy.  They will contact our office to request authorization.  Prescriptions will not be filled after 5pm or on week-ends. °3. You should follow a light diet the first few days after arrival home, such as soup and crackers, etc.  Resume your normal diet the day after surgery. °4. Most patients will experience some swelling and bruising on the chest and underarm.  Ice packs will help.  Swelling and bruising can take several days to resolve.  °5. It is common to experience some constipation if taking pain medication after surgery.  Increasing fluid intake and taking a stool softener (such as Colace) will usually help or prevent this problem from occurring.  A mild laxative (Milk of Magnesia or Miralax) should be taken according to package instructions if there are no bowel movements after 48 hours. °6. Unless discharge instructions indicate otherwise, leave your bandage dry and in place until your next appointment in 3-5 days.  You may take a limited sponge bath.  No tube baths or showers until the drains are removed.  You may have steri-strips (small skin tapes) in place directly over the incision.  These strips should be left on the skin for 7-10 days.  If your surgeon used skin glue on the incision, you may  shower in 24 hours.  The glue will flake off over the next 2-3 weeks.  Any sutures or staples will be removed at the office during your follow-up visit. °7. DRAINS:  If you have drains in place, it is important to keep a list of the amount of drainage produced each day in your drains.  Before leaving the hospital, you should be instructed on drain care.  Call our office if you have any questions about your drains. °8. ACTIVITIES:  You may resume regular (light) daily activities beginning the next day--such as daily self-care, walking, climbing stairs--gradually increasing activities as tolerated.  You may have sexual intercourse when it is comfortable.  Refrain from any heavy lifting or straining until approved by your doctor. °a. You may drive when you are no longer taking prescription pain medication, you can comfortably wear a seatbelt, and you can safely maneuver your car and apply brakes. °b. RETURN TO WORK:  __________________________________________________________ °9. You should see your doctor in the office for a follow-up appointment approximately 3-5 days after your surgery.  Your doctor’s nurse will typically make your follow-up appointment when she calls you with your pathology report.  Expect your pathology report 2-3 business days after your surgery.  You may call to check if you do not hear from us after three days.   °10. OTHER INSTRUCTIONS: ______________________________________________________________________________________________ ____________________________________________________________________________________________ ° °WHEN TO CALL YOUR DOCTOR: °1. Fever over 101.0 °2. Nausea and/or vomiting °3. Extreme swelling or bruising °4. Continued bleeding from incision. °5. Increased pain, redness, or drainage from the   incision. ° °The clinic staff is available to answer your questions during regular business hours.  Please don’t hesitate to call and ask to speak to one of the nurses for clinical  concerns.  If you have a medical emergency, go to the nearest emergency room or call 911.  A surgeon from [facility] is always on call at the hospital. °[address], [address], [place], [place]  [address] ? [address] [address], [place], [place]   [address] °[phone] ? [phone] ? FAX [phone] °Web site: [facility] ° °About my Jackson-Pratt Bulb Drain ° °What is a Jackson-Pratt bulb? °A Jackson-Pratt is a soft, round device used to collect drainage. It is connected to a long, thin drainage catheter, which is held in place by one or two small stiches near your surgical incision site. When the bulb is squeezed, it forms a vacuum, forcing the drainage to empty into the bulb. ° °Emptying the Jackson-Pratt bulb- °To empty the bulb: °1. Release the plug on the top of the bulb. °2. Pour the bulb's contents into a measuring container which your nurse will provide. °3. Record the time emptied and amount of drainage. Empty the drain(s) as often as your     doctor or nurse recommends. ° °Date                  Time                    Amount (Drain 1)                 Amount (Drain 2) ° °_____________________________________________________________________ ° °_____________________________________________________________________ ° °_____________________________________________________________________ ° °_____________________________________________________________________ ° °_____________________________________________________________________ ° °_____________________________________________________________________ ° °_____________________________________________________________________ ° °_____________________________________________________________________ ° °Squeezing the Jackson-Pratt Bulb- °To squeeze the bulb: °1. Make sure the plug at the top of the bulb is open. °2. Squeeze the bulb tightly in your fist. You will hear air squeezing from the bulb. °3. Replace the plug while the bulb is squeezed. °4.  Use a safety pin to attach the bulb to your clothing. This will keep the catheter from     pulling at the bulb insertion site. ° °When to call your doctor- °Call your doctor if: °· Drain site becomes red, swollen or hot. °· You have a fever greater than 101 degrees F. °· There is oozing at the drain site. °· Drain falls out (apply a guaze bandage over the drain hole and secure it with tape). °· Drainage increases daily not related to activity patterns. (You will usually have more drainage when you are active than when you are resting.) °· Drainage has a bad odor. ° °

## 2015-12-19 NOTE — Discharge Summary (Signed)
Patient ID: Sherry Rice [id number] 70 y.o. [birth year]  Admit date: 12/18/2015  Discharge date and time: 12/19/2015  Admitting Physician: Adin Hector  Discharge Physician: Adin Hector  Admission Diagnoses: RECURRENT LEFT BREAST CANCER  Discharge Diagnoses: Recurrent left breast cancer                                         Family history of breast cancer                                         Tobacco abuse, daily                                         Hypertension, benign  Operations: Procedure(s): LEFT TOTAL MASTECTOMY  Admission Condition: good  Discharged Condition: good  Indication for Admission: This is a 70 year old Caucasian female who is admitted electively for surgical management of her recurrent left breast cancer. Initially referred by Dr. Nolon Nations of the [facility]. Dr. London Pepper is her PCP, Sadie Haber at [facility]. .      Significant past history is left breast cancer in the upper outer quadrant 21 years ago, treated with lumpectomy and complete axillary lymph node dissection and adjuvant radiation therapy but no chemotherapy. She states that her nodes were negative. Recent mammograms show breast category densitity D, extremely dense . There was distortion of the left breast in the upper outer quadrant revealing a 1.5 cm spiculated mass at 2:00. There was also some density at the lumpectomy site of the 12 o'clock position. Physical exam showed a firm palpable area superior to the left nipple and around the lumpectomy site. ultrasound of the left axilla was negative.  There is some violaceous discoloration of the skin of the breast, more so in the lower half.  Presumably this is due to radiation therapy.      Image guided biopsy of the left breast mass at 2:00 showed invasive duct carcinoma, ER 100%, PR 0, Ki-67 10%, HER-2 negative. Image guided biopsy of the left breast at the 12 o'clock position also showed an invasive  mammary carcinoma, ER 90%, but her 30%, Ki-67 10%, HER-2 negative.      Comorbidities hypertension. Current tobacco abuse, history of left breast cancer      Family history is positive for breast cancer in her mother had a mastectomy and is a survivor living at age 53.  No other breast cancer ovarian or pancreatic cancer.  Paternal grandmother had colon cancer.      She has seen Dr. Crissie Reese. He has discussed reconstructive techniques, unilateral and bilateral, latissimus flap. He has told her that she must stop smoking for 6 weeks before any reconstructive surgery. She has decided not to have reconstruction and wants a left total mastectomy and attempted sentinel lymph node biopsy.     She has seen Dr. Lindi Adie from medical oncology. He has requested Oncotype testing and may put her on antiestrogen, further decisions pending pathology. CT scans of the chest abdomen and pelvis were ordered by Dr. Lindi Adie. There is no obvious metastatic disease. There is no axillary adenopathy.There is a question of a tiny mass in the uncinate process of the  pancreas, significance unknown. There is borderline mesenteric lymphadenopathy of uncertain significance. Follow-up CT scan in 3-6 months is recommended. I discussed this issue with her and her daughter in detail. They understand that there is not much else to do about this at this time.   She has seen genetic counseling and has had blood drawn and those results are pending.    MRI showed no evidence of malignancy in the right breast or adenopathy.    She is brought to the operating room electively for left total mastectomy and attempted left axilla sentinel node biopsy  Hospital Course: On the day of admission the patient was taken to the operating room and underwent left total mastectomy and attempted sentinel lymph node mapping and biopsy.  Due to her previous complete axillary dissection we did not find any lymph nodes, blue dye, or radioactivity  in the axilla.  The tumor did not appear to be invading muscle or scan.  The lower skin flap was somewhat more discolored than the upper skin flap.    The patient was admitted overnight for observation.  She was reasonably stable.  Pain seemed well controlled with Norco or Dilaudid.  She was ambulating independently and tolerating diet and wanted to go home.  Examination revealed that the mastectomy skin flaps were viable, although the lower skin flap was a little bit more discolored but had ampullary refill.  Both drains were functioning and a little bit bloody but the output volumes were low.  There was no hematoma.  She moved her shoulders around well.      She was given instruction in wound care, drain care, diet, activities.  She was given a prescription for Robaxin and a prescription for hydrocodone.  She was advised to make an appointment to see me in about 7-10 days.  Sooner if there are any problems.  Her daughter is a Equities trader and will watch the wound closely.  Will call the pathology report to her next week some time when it is completed.  Consults: None  Significant Diagnostic Studies: surgical pathology  Treatments: surgery: left total mastectomy  Disposition: Home  Patient Instructions:    Medication List    TAKE these medications   anastrozole 1 MG tablet Commonly known as:  ARIMIDEX Take 1 tablet (1 mg total) by mouth daily.   cholecalciferol 1000 units tablet Commonly known as:  VITAMIN D Take 1,000 Units by mouth daily.   Cinnamon 500 MG Tabs Take by mouth.   HYDROcodone-acetaminophen 5-325 MG tablet Commonly known as:  NORCO/VICODIN Take 1-2 tablets by mouth every 4 (four) hours as needed for moderate pain.   losartan-hydrochlorothiazide 100-25 MG tablet Commonly known as:  HYZAAR TAKE 1 TABLET BY MOUTH EVERY DAY   methocarbamol 500 MG tablet Commonly known as:  ROBAXIN Take 1 tablet (500 mg total) by mouth every 6 (six) hours as needed for muscle  spasms.   MILK THISTLE PO Take by mouth daily.   vitamin C 500 MG tablet Commonly known as:  ASCORBIC ACID Take 500 mg by mouth daily.       Activity: Frequent ambulation and left shoulder range of motion exercises.  No driving for 2 weeks. Diet: low fat, low cholesterol diet Wound Care: as directed  Follow-up:  With Dr. Dalbert Batman in 9-10 days.  Signed: Edsel Petrin. Dalbert Batman, M.D., FACS General and minimally invasive surgery Breast and Colorectal Surgery  12/19/2015, 7:45 AM

## 2015-12-21 ENCOUNTER — Encounter (HOSPITAL_BASED_OUTPATIENT_CLINIC_OR_DEPARTMENT_OTHER): Payer: Self-pay | Admitting: General Surgery

## 2015-12-22 NOTE — Progress Notes (Signed)
Inform patient of Pathology report,.  Tell her that the breast cancer was completely removed.  At least 3.6 cm diameter.  Margins are negative.  No further surgery will be required.  I will discuss this with her in detail at her first office visit.  hmi

## 2015-12-25 ENCOUNTER — Ambulatory Visit: Payer: Medicare Other | Admitting: Hematology and Oncology

## 2015-12-28 ENCOUNTER — Telehealth: Payer: Self-pay | Admitting: Hematology and Oncology

## 2015-12-28 ENCOUNTER — Ambulatory Visit (HOSPITAL_BASED_OUTPATIENT_CLINIC_OR_DEPARTMENT_OTHER): Payer: Medicare Other | Admitting: Hematology and Oncology

## 2015-12-28 ENCOUNTER — Encounter: Payer: Self-pay | Admitting: Hematology and Oncology

## 2015-12-28 VITALS — BP 115/81 | HR 73 | Temp 98.0°F | Resp 18 | Ht 64.0 in | Wt 122.5 lb

## 2015-12-28 DIAGNOSIS — C50412 Malignant neoplasm of upper-outer quadrant of left female breast: Secondary | ICD-10-CM | POA: Diagnosis not present

## 2015-12-28 DIAGNOSIS — K869 Disease of pancreas, unspecified: Secondary | ICD-10-CM

## 2015-12-28 NOTE — Assessment & Plan Note (Signed)
11/10/2015: Left breast biopsy 2:00, 1.5 x 0.7 x 0.7 cm : Grade 1, IDC with ADH ER 100%, PR 0%, Ki-67 10%, HER-2 neg ratio 1.28; 12:00: 2.8 cm Inv mamm ca grade 1-2,  ER 90%, PR 30%, Ki-67 10%, HER-2 neg ratio 1.15; total size 4.8 cm, T2 N0 stage II a clinical stage  12/18/2015: Simple mastectomy: Invasive tubulolobular carcinoma, grade 1, 3.6 cm, low-grade DCIS, LCIS, ER 100% and 90%; PR 0% and 30%, HER-2 negative, Ki-67 10%, T2 NX stage II a   Recommendations: 1. Oncotype DX 2. adjuvant antiestrogen therapy with anastrozole  Patient took anastrozole preoperatively.  Anastrozole toxicities:  Return to clinic based upon Oncotype DX test result. If it is low risk, I plan to see her back again in 6 months.

## 2015-12-28 NOTE — Telephone Encounter (Signed)
appt made and avs printed °

## 2015-12-28 NOTE — Telephone Encounter (Signed)
appt made and avs printed. Ct to be scheduled and contrast given

## 2015-12-28 NOTE — Progress Notes (Signed)
Patient Care Team: London Pepper, MD as PCP - General (Family Medicine) Laurence Spates, MD (Gastroenterology) Jari Pigg, MD (Dermatology)  SUMMARY OF ONCOLOGIC HISTORY:   Breast cancer of upper-outer quadrant of left female breast ([place])   11/10/2015 Initial Diagnosis    Left breast biopsy 2:00, 1.5 x 0.7 x 0.7 cm : Grade 1, IDC with ADH ER 100%, PR 0%, Ki-67 10%, HER-2 neg ratio 1.28; 12:00: 2.8 cm Inv mamm ca grade 1-2,  ER 90%, PR 30%, Ki-67 10%, HER-2 neg ratio 1.15; total size 4.8 cm, T2 N0 stage II a clinical stage      12/18/2015 Surgery    Simple mastectomy: Invasive tubulolobular carcinoma, grade 1, 3.6 cm, low-grade DCIS, LCIS, ER 100% and 90%; PR 0% and 30%, HER-2 negative, Ki-67 10%, T2 NX stage II a        CHIEF COMPLIANT: Follow-up after recent mastectomy  INTERVAL HISTORY: Sherry Rice is a 70 year old with above-mentioned history of left breast cancer treated with mastectomy and is here today to discuss the results of the surgery. She does not have much pain but she is very uncomfortable because of the drains. She is hoping to get those removed. She is emotionally having a difficult time because of the drains.  REVIEW OF SYSTEMS:   Constitutional: Denies fevers, chills or abnormal weight loss Eyes: Denies blurriness of vision Ears, nose, mouth, throat, and face: Denies mucositis or sore throat Respiratory: Denies cough, dyspnea or wheezes Cardiovascular: Denies palpitation, chest discomfort Gastrointestinal:  Denies nausea, heartburn or change in bowel habits Skin: Denies abnormal skin rashes Lymphatics: Denies new lymphadenopathy or easy bruising Neurological:Denies numbness, tingling or new weaknesses Behavioral/Psych: Mood is stable, no new changes  Extremities: No lower extremity edema Breast: Left mastectomy with drains All other systems were reviewed with the patient and are negative.  I have reviewed the past medical history, past surgical history,  social history and family history with the patient and they are unchanged from previous note.  ALLERGIES:  is allergic to codeine.  MEDICATIONS:  Current Outpatient Prescriptions  Medication Sig Dispense Refill  . anastrozole (ARIMIDEX) 1 MG tablet Take 1 tablet (1 mg total) by mouth daily. 90 tablet 3  . cholecalciferol (VITAMIN D) 1000 units tablet Take 1,000 Units by mouth daily.    . Cinnamon 500 MG TABS Take by mouth.    Marland Kitchen HYDROcodone-acetaminophen (NORCO/VICODIN) 5-325 MG tablet Take 1-2 tablets by mouth every 4 (four) hours as needed for moderate pain. 30 tablet 0  . losartan-hydrochlorothiazide (HYZAAR) 100-25 MG per tablet TAKE 1 TABLET BY MOUTH EVERY DAY 90 tablet 1  . methocarbamol (ROBAXIN) 500 MG tablet Take 1 tablet (500 mg total) by mouth every 6 (six) hours as needed for muscle spasms. 20 tablet 1  . MILK THISTLE PO Take by mouth daily.    . vitamin C (ASCORBIC ACID) 500 MG tablet Take 500 mg by mouth daily.     No current facility-administered medications for this visit.     PHYSICAL EXAMINATION: ECOG PERFORMANCE STATUS: 1 - Symptomatic but completely ambulatory  Vitals:   12/28/15 1448  BP: 115/81  Pulse: 73  Resp: 18  Temp: 98 F (36.7 C)   Filed Weights   12/28/15 1448  Weight: 122 lb 8 oz (55.6 kg)    GENERAL:alert, no distress and comfortable SKIN: skin color, texture, turgor are normal, no rashes or significant lesions EYES: normal, Conjunctiva are pink and non-injected, sclera clear OROPHARYNX:no exudate, no erythema and lips, buccal  mucosa, and tongue normal  NECK: supple, thyroid normal size, non-tender, without nodularity LYMPH:  no palpable lymphadenopathy in the cervical, axillary or inguinal LUNGS: clear to auscultation and percussion with normal breathing effort HEART: regular rate & rhythm and no murmurs and no lower extremity edema ABDOMEN:abdomen soft, non-tender and normal bowel sounds MUSCULOSKELETAL:no cyanosis of digits and no clubbing   NEURO: alert & oriented x 3 with fluent speech, no focal motor/sensory deficits EXTREMITIES: No lower extremity edema  LABORATORY DATA:  I have reviewed the data as listed   Chemistry      Component Value Date/Time   NA 142 11/25/2015 1403   K 3.8 11/25/2015 1403   CL 103 04/16/2013 1732   CO2 27 11/25/2015 1403   BUN 15.5 11/25/2015 1403   CREATININE 0.8 11/25/2015 1403      Component Value Date/Time   CALCIUM 9.2 11/25/2015 1403   ALKPHOS 82 11/25/2015 1403   AST 24 11/25/2015 1403   ALT 23 11/25/2015 1403   BILITOT 0.59 11/25/2015 1403       Lab Results  Component Value Date   WBC 7.6 11/25/2015   HGB 14.3 11/25/2015   HCT 42.6 11/25/2015   MCV 103.5 (H) 11/25/2015   PLT 213 11/25/2015   NEUTROABS 3.5 11/25/2015     ASSESSMENT & PLAN:  Breast cancer of upper-outer quadrant of left female breast ([place]) 11/10/2015: Left breast biopsy 2:00, 1.5 x 0.7 x 0.7 cm : Grade 1, IDC with ADH ER 100%, PR 0%, Ki-67 10%, HER-2 neg ratio 1.28; 12:00: 2.8 cm Inv mamm ca grade 1-2,  ER 90%, PR 30%, Ki-67 10%, HER-2 neg ratio 1.15; total size 4.8 cm, T2 N0 stage II a clinical stage  12/18/2015: Simple mastectomy: Invasive tubulolobular carcinoma, grade 1, 3.6 cm, low-grade DCIS, LCIS, ER 100% and 90%; PR 0% and 30%, HER-2 negative, Ki-67 10%, T2 NX stage II a   Recommendations: 1. Oncotype DX 2. adjuvant antiestrogen therapy with anastrozole  Patient Started taking anastrozole preoperatively.  Anastrozole toxicities: Denies any hot flashes or myalgias.  Return to clinic based upon Oncotype DX test result. If it is low risk, I plan to see her back again in 3 months. I counseled her regarding participation in [place] clinical trial.  I will present this to her once again when she returns back to see me in 3 months.  No orders of the defined types were placed in this encounter.  The patient has a good understanding of the overall plan. she agrees with it. she will call with any  problems that may develop before the next visit here.   Rulon Eisenmenger, MD 12/28/15

## 2015-12-29 ENCOUNTER — Telehealth: Payer: Self-pay | Admitting: *Deleted

## 2015-12-29 NOTE — Telephone Encounter (Signed)
Ordered oncotype per Dr. Gudena.  Faxed requisition to pathology and confirmed receipt. 

## 2016-01-07 ENCOUNTER — Telehealth: Payer: Self-pay | Admitting: Hematology and Oncology

## 2016-01-07 NOTE — Telephone Encounter (Signed)
FAXED PT RECORDS TO [place] [phone]

## 2016-01-11 ENCOUNTER — Telehealth: Payer: Self-pay | Admitting: *Deleted

## 2016-01-11 NOTE — Telephone Encounter (Signed)
Received oncotype score of 26/17%. Called pt and left vm to schedule appt with Dr. Lindi Adie to discuss results and next steps. Contact information provied

## 2016-01-11 NOTE — Telephone Encounter (Signed)
Scheduled and confirmed appt with Dr. Lindi Adie on 01/12/16 to discuss oncotype score of 26.

## 2016-01-12 ENCOUNTER — Ambulatory Visit (HOSPITAL_BASED_OUTPATIENT_CLINIC_OR_DEPARTMENT_OTHER): Payer: Medicare Other | Admitting: Hematology and Oncology

## 2016-01-12 ENCOUNTER — Encounter: Payer: Self-pay | Admitting: Hematology and Oncology

## 2016-01-12 ENCOUNTER — Other Ambulatory Visit: Payer: Self-pay | Admitting: General Surgery

## 2016-01-12 VITALS — BP 132/85 | HR 63 | Temp 98.1°F | Resp 18 | Ht 64.0 in | Wt 121.4 lb

## 2016-01-12 DIAGNOSIS — Z9012 Acquired absence of left breast and nipple: Secondary | ICD-10-CM | POA: Diagnosis not present

## 2016-01-12 DIAGNOSIS — Z170 Estrogen receptor positive status [ER+]: Secondary | ICD-10-CM

## 2016-01-12 DIAGNOSIS — C50412 Malignant neoplasm of upper-outer quadrant of left female breast: Secondary | ICD-10-CM

## 2016-01-12 MED ORDER — LIDOCAINE-PRILOCAINE 2.5-2.5 % EX CREA: TOPICAL_CREAM | CUTANEOUS | 3 refills | Status: DC

## 2016-01-12 MED ORDER — ONDANSETRON HCL 8 MG PO TABS: 8.0000 mg | ORAL_TABLET | Freq: Two times a day (BID) | ORAL | 1 refills | Status: DC | PRN

## 2016-01-12 MED ORDER — PROCHLORPERAZINE MALEATE 10 MG PO TABS: 10.0000 mg | ORAL_TABLET | Freq: Four times a day (QID) | ORAL | 1 refills | Status: DC | PRN

## 2016-01-12 MED ORDER — LORAZEPAM 0.5 MG PO TABS: 0.5000 mg | ORAL_TABLET | Freq: Every day | ORAL | 0 refills | Status: DC

## 2016-01-12 MED ORDER — DEXAMETHASONE 4 MG PO TABS
4.0000 mg | ORAL_TABLET | Freq: Every day | ORAL | 1 refills | Status: DC
Start: 2016-01-12 — End: 2016-08-23

## 2016-01-12 NOTE — Progress Notes (Signed)
START ON PATHWAY REGIMEN - Breast  [id number]: TC - Docetaxel, Cyclophosphamide q21 Days x 4 Cycles   A cycle is every 21 days:     Docetaxel (Taxotere(R)) 75 mg/m2 in 250 mL NS IV over 1 hour, q21 days; followed by Dose Mod: None     Cyclophosphamide (Cytoxan(R)) 600 mg/m2 in 250 mL NS IV over 30 minutes, q21 days Dose Mod: None Additional Orders: Premedicate with dexamethasone 8 mg PO bid for three days beginning 1 day prior to therapy  **Always confirm dose/schedule in your pharmacy ordering system**    Patient Characteristics: Adjuvant Therapy, Node Negative, HER2/neu Negative/Unknown/Equivocal, ER Positive, Oncotype Intermediate Risk (18 - 30), Chemotherapy Preferred AJCC Stage Grouping: IIA Current Disease Status: No Distant Mets or Local Recurrence AJCC M Stage: X ER Status: Positive (+) AJCC N Stage: X AJCC T Stage: X HER2/neu: Negative (-) PR Status: Negative (-) Node Status: Negative (-) Has this patient completed genomic testing? Yes - Oncotype DX(R) Oncotype Recurrence Score: 26 Treatment Preferred: Chemotherapy  Intent of Therapy: Curative Intent, Discussed with Patient

## 2016-01-12 NOTE — Assessment & Plan Note (Signed)
11/10/2015: Left breast biopsy 2:00, 1.5 x 0.7 x 0.7 cm : Grade 1, IDC with ADH ER 100%, PR 0%, Ki-67 10%, HER-2 neg ratio 1.28; 12:00: 2.8 cm Inv mamm ca grade 1-2, ER 90%, PR 30%, Ki-67 10%, HER-2 neg ratio 1.15; total size 4.8 cm, T2 N0 stage II a clinical stage  12/18/2015: Simple mastectomy: Invasive tubulolobular carcinoma, grade 1, 3.6 cm, low-grade DCIS, LCIS, ER 100% and 90%; PR 0% and 30%, HER-2 negative, Ki-67 10%, T2 NX stage II a   Oncotype DX score 26, 17% risk of recurrence with tamoxifen alone (intermediate risk)  Recommendation: Adjuvant systemic chemotherapy with Taxotere and Cytoxan every 3 weeks 4 Chemotherapy counseling: I discussed risks and benefits of chemotherapy per the risk of infection, hair loss, nausea, cytopenias, fatigue, loss of taste, long-term bone marrow damage, risk of infection, neuropathy etc. patient is agreeable to proceed with treatment  Plan: 1. Port placement 2. chemotherapy class 3. Start chemotherapy in 1-2 weeks

## 2016-01-12 NOTE — Progress Notes (Signed)
Patient Care Team: London Pepper, MD as PCP - General (Family Medicine) Laurence Spates, MD (Gastroenterology) Jari Pigg, MD (Dermatology)  SUMMARY OF ONCOLOGIC HISTORY:   Breast cancer of upper-outer quadrant of left female breast ([place])   11/10/2015 Initial Diagnosis    Left breast biopsy 2:00, 1.5 x 0.7 x 0.7 cm : Grade 1, IDC with ADH ER 100%, PR 0%, Ki-67 10%, HER-2 neg ratio 1.28; 12:00: 2.8 cm Inv mamm ca grade 1-2,  ER 90%, PR 30%, Ki-67 10%, HER-2 neg ratio 1.15; total size 4.8 cm, T2 N0 stage II a clinical stage      12/18/2015 Surgery    Simple mastectomy: Invasive tubulolobular carcinoma, grade 1, 3.6 cm, low-grade DCIS, LCIS, ER 100% and 90%; PR 0% and 30%, HER-2 negative, Ki-67 10%, T2 NX stage II a        CHIEF COMPLIANT: Follow-up to discuss the result of Oncotype DX  INTERVAL HISTORY: Sherry Rice is a 6 year with above-mentioned history of left breast cancer treated with mastectomy and is here to discuss the result of Oncotype DX score. She is recovering very well from the mastectomy. Oncotype DX came back as 26 intermediate risk of recurrence.  REVIEW OF SYSTEMS:   Constitutional: Denies fevers, chills or abnormal weight loss Eyes: Denies blurriness of vision Ears, nose, mouth, throat, and face: Denies mucositis or sore throat Respiratory: Denies cough, dyspnea or wheezes Cardiovascular: Denies palpitation, chest discomfort Gastrointestinal:  Denies nausea, heartburn or change in bowel habits Skin: Denies abnormal skin rashes Lymphatics: Denies new lymphadenopathy or easy bruising Neurological:Denies numbness, tingling or new weaknesses Behavioral/Psych: Mood is stable, no new changes  Extremities: No lower extremity edema  All other systems were reviewed with the patient and are negative.  I have reviewed the past medical history, past surgical history, social history and family history with the patient and they are unchanged from previous  note.  ALLERGIES:  is allergic to codeine.  MEDICATIONS:  Current Outpatient Prescriptions  Medication Sig Dispense Refill  . anastrozole (ARIMIDEX) 1 MG tablet Take 1 tablet (1 mg total) by mouth daily. 90 tablet 3  . cholecalciferol (VITAMIN D) 1000 units tablet Take 1,000 Units by mouth daily.    . Cinnamon 500 MG TABS Take by mouth.    Marland Kitchen HYDROcodone-acetaminophen (NORCO/VICODIN) 5-325 MG tablet Take 1-2 tablets by mouth every 4 (four) hours as needed for moderate pain. 30 tablet 0  . losartan-hydrochlorothiazide (HYZAAR) 100-25 MG per tablet TAKE 1 TABLET BY MOUTH EVERY DAY 90 tablet 1  . methocarbamol (ROBAXIN) 500 MG tablet Take 1 tablet (500 mg total) by mouth every 6 (six) hours as needed for muscle spasms. 20 tablet 1  . MILK THISTLE PO Take by mouth daily.    . vitamin C (ASCORBIC ACID) 500 MG tablet Take 500 mg by mouth daily.     No current facility-administered medications for this visit.     PHYSICAL EXAMINATION: ECOG PERFORMANCE STATUS: 1 - Symptomatic but completely ambulatory  Vitals:   01/12/16 1129  BP: 132/85  Pulse: 63  Resp: 18  Temp: 98.1 F (36.7 C)   Filed Weights   01/12/16 1129  Weight: 121 lb 6.4 oz (55.1 kg)    GENERAL:alert, no distress and comfortable SKIN: skin color, texture, turgor are normal, no rashes or significant lesions EYES: normal, Conjunctiva are pink and non-injected, sclera clear OROPHARYNX:no exudate, no erythema and lips, buccal mucosa, and tongue normal  NECK: supple, thyroid normal size, non-tender, without nodularity LYMPH:  no palpable lymphadenopathy in the cervical, axillary or inguinal LUNGS: clear to auscultation and percussion with normal breathing effort HEART: regular rate & rhythm and no murmurs and no lower extremity edema ABDOMEN:abdomen soft, non-tender and normal bowel sounds MUSCULOSKELETAL:no cyanosis of digits and no clubbing  NEURO: alert & oriented x 3 with fluent speech, no focal motor/sensory  deficits EXTREMITIES: No lower extremity edema  LABORATORY DATA:  I have reviewed the data as listed   Chemistry      Component Value Date/Time   NA 142 11/25/2015 1403   K 3.8 11/25/2015 1403   CL 103 04/16/2013 1732   CO2 27 11/25/2015 1403   BUN 15.5 11/25/2015 1403   CREATININE 0.8 11/25/2015 1403      Component Value Date/Time   CALCIUM 9.2 11/25/2015 1403   ALKPHOS 82 11/25/2015 1403   AST 24 11/25/2015 1403   ALT 23 11/25/2015 1403   BILITOT 0.59 11/25/2015 1403       Lab Results  Component Value Date   WBC 7.6 11/25/2015   HGB 14.3 11/25/2015   HCT 42.6 11/25/2015   MCV 103.5 (H) 11/25/2015   PLT 213 11/25/2015   NEUTROABS 3.5 11/25/2015     ASSESSMENT & PLAN:  Breast cancer of upper-outer quadrant of left female breast ([place]) 11/10/2015: Left breast biopsy 2:00, 1.5 x 0.7 x 0.7 cm : Grade 1, IDC with ADH ER 100%, PR 0%, Ki-67 10%, HER-2 neg ratio 1.28; 12:00: 2.8 cm Inv mamm ca grade 1-2, ER 90%, PR 30%, Ki-67 10%, HER-2 neg ratio 1.15; total size 4.8 cm, T2 N0 stage II a clinical stage  12/18/2015: Simple mastectomy: Invasive tubulolobular carcinoma, grade 1, 3.6 cm, low-grade DCIS, LCIS, ER 100% and 90%; PR 0% and 30%, HER-2 negative, Ki-67 10%, T2 NX stage II a   Oncotype DX score 26, 17% risk of recurrence with tamoxifen alone (intermediate risk)  Recommendation: Adjuvant systemic chemotherapy with Taxotere and Cytoxan every 3 weeks 4 Chemotherapy counseling: I discussed risks and benefits of chemotherapy per the risk of infection, hair loss, nausea, cytopenias, fatigue, loss of taste, long-term bone marrow damage, risk of infection, neuropathy etc. patient is agreeable to proceed with treatment  Plan: 1. Port placement 2. chemotherapy class 3. Start chemotherapy in 1-2 weeks   No orders of the defined types were placed in this encounter.  The patient has a good understanding of the overall plan. she agrees with it. she will call with any problems  that may develop before the next visit here.   Rulon Eisenmenger, MD 01/12/16

## 2016-01-18 ENCOUNTER — Telehealth: Payer: Self-pay | Admitting: *Deleted

## 2016-01-18 NOTE — Telephone Encounter (Signed)
Received notification that pt wanted to cancel port placement until after she has CT abd/pelvis for pancreatic mass. Central scheduling called to see if they could accommodate CT of abd/pelvis this week (9/18-9/22). Informed they had several openings and that they would call her to schedule. Noticed appt for CT abd/pelvis was scheduled for 10/27. Called pt to clarify that she wanted to wait to 10/27. Per pt- wishes to have CT scan at the end of October per recommendations from radiologist and she wanted to "fully heal" from surgery. Pt wishes to still attend chemo class on 9/19. Confirmed appt with pt.  Informed physician team of pt wishes. Msg sent to request cancellation of chemo.  Appt scheduled and confirmed for post scan review with Dr. Lindi Adie on 10/30.  Pt denies further needs at this time.

## 2016-01-19 ENCOUNTER — Encounter: Payer: Self-pay | Admitting: *Deleted

## 2016-01-19 ENCOUNTER — Other Ambulatory Visit: Payer: Medicare Other

## 2016-01-21 ENCOUNTER — Inpatient Hospital Stay (HOSPITAL_COMMUNITY): Admission: RE | Admit: 2016-01-21 | Payer: Medicare Other | Source: Ambulatory Visit

## 2016-01-25 DIAGNOSIS — Z1379 Encounter for other screening for genetic and chromosomal anomalies: Secondary | ICD-10-CM | POA: Insufficient documentation

## 2016-01-25 NOTE — Progress Notes (Signed)
GENETIC TEST RESULT  HPI: Sherry Rice was previously seen in the [facility] due to a personal and family history of breast cancer and concerns regarding a hereditary predisposition to cancer. Please refer to our prior cancer genetics clinic note from November 26, 2015 for more information regarding Sherry Rice's medical, social and family histories, and our assessment and recommendations, at the time. Sherry Rice recent genetic test results were disclosed to her, as were recommendations warranted by these results. These results and recommendations are discussed in more detail below.  GENETIC TEST RESULTS: At the time of Sherry Rice's visit on 11/26/15, we recommended she pursue genetic testing of the 20-gene Breast/Ovarian Cancer Panel with MSH2 Exons 1-7 Inversion Analysis. The Breast/Ovarian Cancer Panel offered by [facility] Sherry Cairo, MD) includes sequencing and deletion/duplication analysis for the following 19 genes:  ATM, BARD1, BRCA1, BRCA2, BRIP1, CDH1, CHEK2, FANCC, MLH1, MSH2, MSH6, NBN, PALB2, PMS2, PTEN, RAD51C, RAD51D, TP53, and XRCC2.  This panel also includes deletion/duplication analysis (without sequencing) for one gene, EPCAM.  Those results are now back, the report date for which is December 08, 2015.  Genetic testing was normal, and did not reveal a deleterious mutation in these genes.  Additionally, no variants of uncertain significance (VUSes) were found.  The test report will be scanned into EPIC and will be located under the Results Review tab in the Pathology>Molecular Pathology section.   We discussed with Sherry Rice that since the current genetic testing is not perfect, it is possible there may be a gene mutation in one of these genes that current testing cannot detect, but that chance is small. We also discussed, that it is possible that another gene that has not yet been discovered, or that we have not yet tested, is responsible for the cancer diagnoses in  the family, and it is, therefore, important to remain in touch with cancer genetics in the future so that we can continue to offer Sherry Rice the most up-to-date genetic testing.   CANCER SCREENING RECOMMENDATIONS: This result is reassuring and indicates that Sherry Rice likely does not have an increased risk for a future cancer due to a mutation in one of these genes. This normal test also suggests that Sherry Rice's cancer was most likely not due to an inherited predisposition associated with one of these genes.  Most cancers happen by chance and this negative test suggests that her cancer falls into this category.  We, therefore, recommended she continue to follow the cancer management and screening guidelines provided by her oncology and primary healthcare providers.   RECOMMENDATIONS FOR FAMILY MEMBERS: Women in this family are at some increased risk of developing cancer, over the general population risk, simply due to the family history of cancer. We recommended women in this family have a yearly mammogram beginning at age 72, or 32 years younger than the earliest onset of cancer, an annual clinical breast exam, and perform monthly breast self-exams.  Thus, Sherry Rice granddaughters can begin getting annual mammograms at the age of 34, due to her history of breast cancer at 55.  Sherry Rice daughters and her sister should make their primary doctors aware of the family history of breast cancer, so that they may continue to receive the most appropriate screening.  Women in this family should also have a gynecological exam as recommended by their primary provider. All family members should have a colonoscopy by age 80.  FOLLOW-UP: Lastly, we discussed with Sherry Rice that cancer genetics  is a rapidly advancing field and it is possible that new genetic tests will be appropriate for her and/or her family members in the future. We encouraged her to remain in contact with cancer genetics on an annual basis so we  can update her personal and family histories and let her know of advances in cancer genetics that may benefit this family.   Our contact number was provided. Ms. Kluth questions were answered to her satisfaction, and she knows she is welcome to call us at anytime with additional questions or concerns.   Sherry Luz, MS, [facility] Certified Genetic Counselor [email or web address] Phone: [phone]

## 2016-01-26 ENCOUNTER — Ambulatory Visit (HOSPITAL_COMMUNITY): Admission: RE | Admit: 2016-01-26 | Payer: Medicare Other | Source: Ambulatory Visit | Admitting: General Surgery

## 2016-01-26 ENCOUNTER — Encounter (HOSPITAL_COMMUNITY): Admission: RE | Payer: Self-pay | Source: Ambulatory Visit

## 2016-01-26 SURGERY — INSERTION, TUNNELED CENTRAL VENOUS DEVICE, WITH PORT
Anesthesia: General

## 2016-01-27 ENCOUNTER — Ambulatory Visit: Payer: Medicare Other

## 2016-01-27 ENCOUNTER — Other Ambulatory Visit: Payer: Medicare Other

## 2016-02-03 ENCOUNTER — Ambulatory Visit: Payer: Medicare Other | Admitting: Hematology and Oncology

## 2016-02-03 ENCOUNTER — Other Ambulatory Visit: Payer: Medicare Other

## 2016-02-04 ENCOUNTER — Other Ambulatory Visit: Payer: Medicare Other

## 2016-02-04 ENCOUNTER — Ambulatory Visit: Payer: Medicare Other | Admitting: Hematology and Oncology

## 2016-02-08 ENCOUNTER — Encounter: Payer: Self-pay | Admitting: Hematology and Oncology

## 2016-02-26 ENCOUNTER — Encounter (HOSPITAL_COMMUNITY): Payer: Self-pay

## 2016-02-26 ENCOUNTER — Ambulatory Visit (HOSPITAL_COMMUNITY)
Admission: RE | Admit: 2016-02-26 | Discharge: 2016-02-26 | Disposition: A | Discharge status: Home / Self Care | Payer: Medicare Other | Source: Ambulatory Visit | Attending: Hematology and Oncology | Admitting: Hematology and Oncology

## 2016-02-26 ENCOUNTER — Other Ambulatory Visit (HOSPITAL_BASED_OUTPATIENT_CLINIC_OR_DEPARTMENT_OTHER): Payer: Medicare Other

## 2016-02-26 DIAGNOSIS — D1809 Hemangioma of other sites: Secondary | ICD-10-CM | POA: Insufficient documentation

## 2016-02-26 DIAGNOSIS — K869 Disease of pancreas, unspecified: Secondary | ICD-10-CM | POA: Insufficient documentation

## 2016-02-26 DIAGNOSIS — C50412 Malignant neoplasm of upper-outer quadrant of left female breast: Secondary | ICD-10-CM | POA: Diagnosis not present

## 2016-02-26 LAB — COMPREHENSIVE METABOLIC PANEL
ALT: 23 U/L (ref 0–55)
ANION GAP: 9 meq/L (ref 3–11)
AST: 27 U/L (ref 5–34)
Albumin: 4.3 g/dL (ref 3.5–5.0)
Alkaline Phosphatase: 85 U/L (ref 40–150)
BUN: 12.9 mg/dL (ref 7.0–26.0)
CHLORIDE: 104 meq/L (ref 98–109)
CO2: 28 meq/L (ref 22–29)
CREATININE: 0.7 mg/dL (ref 0.6–1.1)
Calcium: 9.7 mg/dL (ref 8.4–10.4)
EGFR: 89 mL/min/{1.73_m2} — ABNORMAL LOW (ref >90)
Glucose: 91 mg/dl (ref 70–140)
POTASSIUM: 4.9 meq/L (ref 3.5–5.1)
Sodium: 141 mEq/L (ref 136–145)
Total Bilirubin: 0.55 mg/dL (ref 0.20–1.20)
Total Protein: 7.5 g/dL (ref 6.4–8.3)

## 2016-02-26 LAB — CBC WITH DIFFERENTIAL/PLATELET
BASO%: 0.5 % (ref 0.0–2.0)
Basophils Absolute: 0.1 10*3/uL (ref 0.0–0.1)
EOS%: 2.3 % (ref 0.0–7.0)
Eosinophils Absolute: 0.2 10*3/uL (ref 0.0–0.5)
HCT: 45 % (ref 34.8–46.6)
HGB: 15.1 g/dL (ref 11.6–15.9)
LYMPH%: 35.9 % (ref 14.0–49.7)
MCH: 34.7 pg — AB (ref 25.1–34.0)
MCHC: 33.5 g/dL (ref 31.5–36.0)
MCV: 103.6 fL — ABNORMAL HIGH (ref 79.5–101.0)
MONO#: 0.7 10*3/uL (ref 0.1–0.9)
MONO%: 7.4 % (ref 0.0–14.0)
NEUT#: 5.4 10*3/uL (ref 1.5–6.5)
NEUT%: 53.9 % (ref 38.4–76.8)
PLATELETS: 216 10*3/uL (ref 145–400)
RBC: 4.35 10*6/uL (ref 3.70–5.45)
RDW: 12.3 % (ref 11.2–14.5)
WBC: 10 10*3/uL (ref 3.9–10.3)
lymph#: 3.6 10*3/uL — ABNORMAL HIGH (ref 0.9–3.3)

## 2016-02-26 MED ORDER — IOPAMIDOL (ISOVUE-300) INJECTION 61%
100.0000 mL | Freq: Once | INTRAVENOUS | Status: AC | PRN
  Administered 2016-02-26: 100 mL via INTRAVENOUS

## 2016-02-29 ENCOUNTER — Encounter: Payer: Self-pay | Admitting: Hematology and Oncology

## 2016-02-29 ENCOUNTER — Ambulatory Visit (HOSPITAL_BASED_OUTPATIENT_CLINIC_OR_DEPARTMENT_OTHER): Payer: Medicare Other | Admitting: Hematology and Oncology

## 2016-02-29 DIAGNOSIS — K869 Disease of pancreas, unspecified: Secondary | ICD-10-CM | POA: Diagnosis not present

## 2016-02-29 DIAGNOSIS — C50412 Malignant neoplasm of upper-outer quadrant of left female breast: Secondary | ICD-10-CM

## 2016-02-29 DIAGNOSIS — Z170 Estrogen receptor positive status [ER+]: Secondary | ICD-10-CM

## 2016-02-29 NOTE — Progress Notes (Signed)
Patient Care Team: Darcus Austin, MD as PCP - General (Family Medicine) Laurence Spates, MD (Gastroenterology) Jari Pigg, MD (Dermatology)  DIAGNOSIS:  Encounter Diagnosis  Name Primary?  . Malignant neoplasm of upper-outer quadrant of left breast in female, estrogen receptor positive ([place])     SUMMARY OF ONCOLOGIC HISTORY:   Breast cancer of upper-outer quadrant of left female breast ([place])   11/10/2015 Initial Diagnosis    Left breast biopsy 2:00, 1.5 x 0.7 x 0.7 cm : Grade 1, IDC with ADH ER 100%, PR 0%, Ki-67 10%, HER-2 neg ratio 1.28; 12:00: 2.8 cm Inv mamm ca grade 1-2,  ER 90%, PR 30%, Ki-67 10%, HER-2 neg ratio 1.15; total size 4.8 cm, T2 N0 stage II a clinical stage      12/18/2015 Surgery    Simple mastectomy: Invasive tubulolobular carcinoma, grade 1, 3.6 cm, low-grade DCIS, LCIS, ER 100% and 90%; PR 0% and 30%, HER-2 negative, Ki-67 10%, T2 NX stage II a       02/22/2016 Imaging    CT abdomen pelvis to evaluate pancreatic lesion: Less conspicuous uncinate process of pancreas lesion measuring 8 x 4 mm previously (11 x 8 mm)       CHIEF COMPLIANT: Follow-up of recent CT scan  INTERVAL HISTORY: Sherry Rice is a 70 year old with above-mentioned history left breast cancer treated with mastectomy. She has decided to hold off on chemotherapy. She is currently on oral antiestrogen therapy with anastrozole. She is tolerating anastrozole extremely well. She denies any hot flashes or myalgias. She exercises very actively and stays very busy. She feels of the breasts is finally healed up. She had an lesion in the pancreas and had a repeat CT scan which showed that the pancreatic lesion is much smaller and less conspicuous. She has not yet decided on chemotherapy.  REVIEW OF SYSTEMS:   Constitutional: Denies fevers, chills or abnormal weight loss Eyes: Denies blurriness of vision Ears, nose, mouth, throat, and face: Denies mucositis or sore throat Respiratory: Denies cough,  dyspnea or wheezes Cardiovascular: Denies palpitation, chest discomfort Gastrointestinal:  Denies nausea, heartburn or change in bowel habits Skin: Denies abnormal skin rashes Lymphatics: Denies new lymphadenopathy or easy bruising Neurological:Denies numbness, tingling or new weaknesses Behavioral/Psych: Mood is stable, no new changes  Extremities: No lower extremity edema Breast: Improvement in the breast tenderness from prior breast surgery All other systems were reviewed with the patient and are negative.  I have reviewed the past medical history, past surgical history, social history and family history with the patient and they are unchanged from previous note.  ALLERGIES:  is allergic to codeine.  MEDICATIONS:  Current Outpatient Prescriptions  Medication Sig Dispense Refill  . anastrozole (ARIMIDEX) 1 MG tablet Take 1 tablet (1 mg total) by mouth daily. 90 tablet 3  . cholecalciferol (VITAMIN D) 1000 units tablet Take 1,000 Units by mouth daily.    . Cinnamon 500 MG TABS Take by mouth.    . dexamethasone (DECADRON) 4 MG tablet Take 1 tablet (4 mg total) by mouth daily. Take 1 tablet daily for chemotherapy in 1 tablet daily after 12 tablet 1  . HYDROcodone-acetaminophen (NORCO/VICODIN) 5-325 MG tablet Take 1-2 tablets by mouth every 4 (four) hours as needed for moderate pain. 30 tablet 0  . lidocaine-prilocaine (EMLA) cream Apply to affected area once 30 g 3  . LORazepam (ATIVAN) 0.5 MG tablet Take 1 tablet (0.5 mg total) by mouth at bedtime. 30 tablet 0  . losartan-hydrochlorothiazide (HYZAAR) 100-25 MG  per tablet TAKE 1 TABLET BY MOUTH EVERY DAY 90 tablet 1  . methocarbamol (ROBAXIN) 500 MG tablet Take 1 tablet (500 mg total) by mouth every 6 (six) hours as needed for muscle spasms. 20 tablet 1  . MILK THISTLE PO Take by mouth daily.    . ondansetron (ZOFRAN) 8 MG tablet Take 1 tablet (8 mg total) by mouth 2 (two) times daily as needed for refractory nausea / vomiting. Start on  day 3 after chemo. 30 tablet 1  . prochlorperazine (COMPAZINE) 10 MG tablet Take 1 tablet (10 mg total) by mouth every 6 (six) hours as needed (Nausea or vomiting). 30 tablet 1  . vitamin C (ASCORBIC ACID) 500 MG tablet Take 500 mg by mouth daily.     No current facility-administered medications for this visit.     PHYSICAL EXAMINATION: ECOG PERFORMANCE STATUS: 1 - Symptomatic but completely ambulatory  Vitals:   02/29/16 1353  BP: (!) 142/84  Pulse: 66  Resp: 18  Temp: 98 F (36.7 C)   Filed Weights   02/29/16 1353  Weight: 124 lb 8 oz (56.5 kg)    GENERAL:alert, no distress and comfortable SKIN: skin color, texture, turgor are normal, no rashes or significant lesions EYES: normal, Conjunctiva are pink and non-injected, sclera clear OROPHARYNX:no exudate, no erythema and lips, buccal mucosa, and tongue normal  NECK: supple, thyroid normal size, non-tender, without nodularity LYMPH:  no palpable lymphadenopathy in the cervical, axillary or inguinal LUNGS: clear to auscultation and percussion with normal breathing effort HEART: regular rate & rhythm and no murmurs and no lower extremity edema ABDOMEN:abdomen soft, non-tender and normal bowel sounds MUSCULOSKELETAL:no cyanosis of digits and no clubbing  NEURO: alert & oriented x 3 with fluent speech, no focal motor/sensory deficits EXTREMITIES: No lower extremity edema  LABORATORY DATA:  I have reviewed the data as listed   Chemistry      Component Value Date/Time   NA 141 02/26/2016 0859   K 4.9 02/26/2016 0859   CL 103 04/16/2013 1732   CO2 28 02/26/2016 0859   BUN 12.9 02/26/2016 0859   CREATININE 0.7 02/26/2016 0859      Component Value Date/Time   CALCIUM 9.7 02/26/2016 0859   ALKPHOS 85 02/26/2016 0859   AST 27 02/26/2016 0859   ALT 23 02/26/2016 0859   BILITOT 0.55 02/26/2016 0859       Lab Results  Component Value Date   WBC 10.0 02/26/2016   HGB 15.1 02/26/2016   HCT 45.0 02/26/2016   MCV 103.6  (H) 02/26/2016   PLT 216 02/26/2016   NEUTROABS 5.4 02/26/2016     ASSESSMENT & PLAN:  Breast cancer of upper-outer quadrant of left female breast ([place]) 11/10/2015: Left breast biopsy 2:00, 1.5 x 0.7 x 0.7 cm : Grade 1, IDC with ADH ER 100%, PR 0%, Ki-67 10%, HER-2 neg ratio 1.28; 12:00: 2.8 cm Inv mamm ca grade 1-2, ER 90%, PR 30%, Ki-67 10%, HER-2 neg ratio 1.15; total size 4.8 cm, T2 N0 stage II a clinical stage  12/18/2015: Simple mastectomy: Invasive tubulolobular carcinoma, grade 1, 3.6 cm, low-grade DCIS, LCIS, ER 100% and 90%; PR 0% and 30%, HER-2 negative, Ki-67 10%, T2 NX stage II a   Oncotype DX score 26, 17% risk of recurrence with tamoxifen alone (intermediate risk) CT abdomen pelvis 02/26/2016: No evidence of metastatic disease  Treatment plan:  1. Adjuvant systemic chemotherapy with Taxotere and Cytoxan every 3 weeks 4 (patient has not finalized her decision regarding  chemotherapy) 2. followed by adjuvant antiestrogen therapy with anastrozole 5-10 years (started September 2017) --------------------------------------------------------------------------------------------------------------------------------------------------------------- Anastrozole toxicities: Denies any hot flashes or myalgias. She is tolerating extremely well. Chemotherapy discussion: I discussed the patient once again the risks and benefits of chemotherapy. She fully understands that the chemotherapy does have moderate degree of benefit. She is not sure if she wants to receive chemotherapy for such a benefit. I instructed her that she needs to make a decision as soon as possible because of benefits of systemic chemotherapy would diminish over time.  Patient will call us back next week to discuss her plan. Depending on that we can make a follow-up appointment. If she does not want to get chemotherapy, then we can see her once every 6 months.  No orders of the defined types were placed in this  encounter.  The patient has a good understanding of the overall plan. she agrees with it. she will call with any problems that may develop before the next visit here.   Rulon Eisenmenger, MD 02/29/16

## 2016-02-29 NOTE — Assessment & Plan Note (Signed)
11/10/2015: Left breast biopsy 2:00, 1.5 x 0.7 x 0.7 cm : Grade 1, IDC with ADH ER 100%, PR 0%, Ki-67 10%, HER-2 neg ratio 1.28; 12:00: 2.8 cm Inv mamm ca grade 1-2, ER 90%, PR 30%, Ki-67 10%, HER-2 neg ratio 1.15; total size 4.8 cm, T2 N0 stage II a clinical stage  12/18/2015: Simple mastectomy: Invasive tubulolobular carcinoma, grade 1, 3.6 cm, low-grade DCIS, LCIS, ER 100% and 90%; PR 0% and 30%, HER-2 negative, Ki-67 10%, T2 NX stage II a   Oncotype DX score 26, 17% risk of recurrence with tamoxifen alone (intermediate risk) CT abdomen pelvis 02/26/2016: No evidence of metastatic disease  Treatment plan:  1. Adjuvant systemic chemotherapy with Taxotere and Cytoxan every 3 weeks 4 2. followed by adjuvant antiestrogen therapy with anastrozole 5-10 years ---------------------------------------------------------------------------------------------------------------------------------------------------------------

## 2016-03-08 ENCOUNTER — Telehealth: Payer: Self-pay | Admitting: *Deleted

## 2016-03-08 NOTE — Telephone Encounter (Signed)
error 

## 2016-03-08 NOTE — Telephone Encounter (Signed)
Called pt to see if she had made a decision on chemotherapy. Pt relate she has decided not to have chemotherapy. Discussed continuing anastrozole and her f/u appt with Dr. Lindi Adie will be in 6 months. Encourage pt to call with needs. Denies further questions.

## 2016-03-29 ENCOUNTER — Ambulatory Visit: Payer: Medicare Other | Admitting: Hematology and Oncology

## 2016-07-11 ENCOUNTER — Encounter (HOSPITAL_COMMUNITY): Payer: Self-pay

## 2016-07-22 ENCOUNTER — Telehealth: Payer: Self-pay | Admitting: Hematology and Oncology

## 2016-07-22 NOTE — Telephone Encounter (Signed)
lvm to inform pt of SCP appt in April per LOS

## 2016-07-24 ENCOUNTER — Encounter: Payer: Self-pay | Admitting: Hematology and Oncology

## 2016-08-09 ENCOUNTER — Emergency Department (HOSPITAL_BASED_OUTPATIENT_CLINIC_OR_DEPARTMENT_OTHER): Payer: Medicare Other

## 2016-08-09 ENCOUNTER — Emergency Department (HOSPITAL_BASED_OUTPATIENT_CLINIC_OR_DEPARTMENT_OTHER)
Admission: EM | Admit: 2016-08-09 | Discharge: 2016-08-09 | Disposition: A | Discharge status: Home / Self Care | Payer: Medicare Other | Attending: Emergency Medicine | Admitting: Emergency Medicine

## 2016-08-09 ENCOUNTER — Encounter (HOSPITAL_BASED_OUTPATIENT_CLINIC_OR_DEPARTMENT_OTHER): Payer: Self-pay | Admitting: *Deleted

## 2016-08-09 DIAGNOSIS — F17210 Nicotine dependence, cigarettes, uncomplicated: Secondary | ICD-10-CM | POA: Diagnosis not present

## 2016-08-09 DIAGNOSIS — I10 Essential (primary) hypertension: Secondary | ICD-10-CM | POA: Insufficient documentation

## 2016-08-09 DIAGNOSIS — Z853 Personal history of malignant neoplasm of breast: Secondary | ICD-10-CM | POA: Insufficient documentation

## 2016-08-09 DIAGNOSIS — W101XXA Fall (on)(from) sidewalk curb, initial encounter: Secondary | ICD-10-CM | POA: Insufficient documentation

## 2016-08-09 DIAGNOSIS — Y9301 Activity, walking, marching and hiking: Secondary | ICD-10-CM | POA: Insufficient documentation

## 2016-08-09 DIAGNOSIS — Y999 Unspecified external cause status: Secondary | ICD-10-CM | POA: Insufficient documentation

## 2016-08-09 DIAGNOSIS — S6992XA Unspecified injury of left wrist, hand and finger(s), initial encounter: Secondary | ICD-10-CM | POA: Diagnosis present

## 2016-08-09 DIAGNOSIS — Z85828 Personal history of other malignant neoplasm of skin: Secondary | ICD-10-CM | POA: Insufficient documentation

## 2016-08-09 DIAGNOSIS — S52502A Unspecified fracture of the lower end of left radius, initial encounter for closed fracture: Secondary | ICD-10-CM | POA: Diagnosis not present

## 2016-08-09 DIAGNOSIS — Z79899 Other long term (current) drug therapy: Secondary | ICD-10-CM | POA: Insufficient documentation

## 2016-08-09 DIAGNOSIS — Y929 Unspecified place or not applicable: Secondary | ICD-10-CM | POA: Insufficient documentation

## 2016-08-09 MED ORDER — ACETAMINOPHEN 325 MG PO TABS
650.0000 mg | ORAL_TABLET | Freq: Once | ORAL | Status: AC
  Administered 2016-08-09: 650 mg via ORAL
  Filled 2016-08-09: qty 2

## 2016-08-09 NOTE — ED Provider Notes (Signed)
[facility] Provider Note   CSN: [id number] Arrival date & time: 08/09/16  1050     History   Chief Complaint Chief Complaint  Patient presents with  . Wrist Pain    HPI Sherry Rice is a 71 y.o. female.  HPI 71 year old Caucasian female past medical history significant for hypertension, anxiety, breast cancer presents to the ED today with complaints of left wrist pain after mechanical fall yesterday. Patient states she was walking her dog when she tripped over the curb landing onto her left wrist. Patient did not take any medications prior to arrival. She's been icing her wrist last night with little relief. Decided come to the ED for imaging today. Denies any paresthesias, weakness, wound. Patient states she does have range of motion but it is painful. Denies any elbow or shoulder pain. Denies hitting her head or LOC. Past Medical History:  Diagnosis Date  . Anxiety    PMH of  . Breast CA ([place]) 1995   left breast-lumpectomy, chemo  . Cancer ([place])    breast, skin - basal cell and squamous cell  . Hypertension     Patient Active Problem List   Diagnosis Date Noted  . Pancreatic lesion 02/29/2016  . Genetic testing 01/25/2016  . Cancer of overlapping sites of left breast ([place]) 12/18/2015  . Family history of breast cancer in female 11/27/2015  . Breast cancer of upper-outer quadrant of left female breast ([place]) 11/19/2015  . Breast lump in female 07/25/2013  . Benign positional vertigo 04/12/2012  . Nonspecific abnormal electrocardiogram (ECG) (EKG) 04/12/2012  . SMOKER 11/17/2009  . Essential hypertension 11/17/2009  . BREAST CANCER, HX OF 11/17/2009  . SKIN CANCER, HX OF 11/17/2009    Past Surgical History:  Procedure Laterality Date  . BREAST LUMPECTOMY  1995   radiation w/o chemotherapy  . COLONOSCOPY  2007 & 2012   negative x 2 , Dr  Laurence Spates  . lymph node resection  1995   17 LN negative  . TONSILLECTOMY AND ADENOIDECTOMY    . TOTAL  MASTECTOMY Left 12/18/2015   Procedure: LEFT TOTAL MASTECTOMY;  Surgeon: Fanny Skates, MD;  Location: [facility];  Service: General;  Laterality: Left;    OB History    No data available       Home Medications    Prior to Admission medications   Medication Sig Start Date End Date Taking? Authorizing Provider  anastrozole (ARIMIDEX) 1 MG tablet Take 1 tablet (1 mg total) by mouth daily. 11/19/15   Nicholas Lose, MD  cholecalciferol (VITAMIN D) 1000 units tablet Take 1,000 Units by mouth daily.    Historical Provider, MD  Cinnamon 500 MG TABS Take by mouth.    Historical Provider, MD  dexamethasone (DECADRON) 4 MG tablet Take 1 tablet (4 mg total) by mouth daily. Take 1 tablet daily for chemotherapy in 1 tablet daily after 01/12/16   Nicholas Lose, MD  HYDROcodone-acetaminophen (NORCO/VICODIN) 5-325 MG tablet Take 1-2 tablets by mouth every 4 (four) hours as needed for moderate pain. 12/19/15   Fanny Skates, MD  lidocaine-prilocaine (EMLA) cream Apply to affected area once 01/12/16   Nicholas Lose, MD  LORazepam (ATIVAN) 0.5 MG tablet Take 1 tablet (0.5 mg total) by mouth at bedtime. 01/12/16   Nicholas Lose, MD  losartan-hydrochlorothiazide (HYZAAR) 100-25 MG per tablet TAKE 1 TABLET BY MOUTH EVERY DAY 10/07/13   Hendricks Limes, MD  methocarbamol (ROBAXIN) 500 MG tablet Take 1 tablet (500 mg total)  by mouth every 6 (six) hours as needed for muscle spasms. 12/19/15   Fanny Skates, MD  MILK THISTLE PO Take by mouth daily.    Historical Provider, MD  ondansetron (ZOFRAN) 8 MG tablet Take 1 tablet (8 mg total) by mouth 2 (two) times daily as needed for refractory nausea / vomiting. Start on day 3 after chemo. 01/12/16   Nicholas Lose, MD  prochlorperazine (COMPAZINE) 10 MG tablet Take 1 tablet (10 mg total) by mouth every 6 (six) hours as needed (Nausea or vomiting). 01/12/16   Nicholas Lose, MD  vitamin C (ASCORBIC ACID) 500 MG tablet Take 500 mg by mouth daily.    Historical Provider, MD     Family History Family History  Problem Relation Age of Onset  . Osteoarthritis Mother   . Breast cancer Mother 38  . Hyperlipidemia Mother   . Hypertension Mother   . Skin cancer Mother     dx squamous and basal cell carcinomas; +sun exposure  . Hypertension Father   . Diabetes Father     diet controlled  . Dementia Father   . Skin cancer Father     hx basal cell carcinoma  . Hypertension Sister   . Other Sister 17    hx of hysterectomy for unspecified reason  . Colon cancer Paternal Grandmother 76  . Other Maternal Grandfather     blood or kidney disease; d. 11  . Stroke Paternal Grandfather 48  . Skin cancer Daughter     basal or squamous type  . Skin cancer Daughter     basal or squamous type  . Skin cancer Daughter     basal or squamous type  . Breast cancer Cousin 1    paternal 1st cousin  . Heart disease Neg Hx     Social History Social History  Substance Use Topics  . Smoking status: Current Every Day Smoker    Packs/day: 0.50    Years: 48.00    Types: Cigarettes  . Smokeless tobacco: Never Used     Comment: smoked 1969-present, max 1 ppd , now 1/2 ppd  . Alcohol use 8.4 oz/week    14 Glasses of wine per week     Comment: 3 glasses of wine most days     Allergies   Codeine   Review of Systems Review of Systems  Constitutional: Negative for chills and fever.  Musculoskeletal: Positive for arthralgias and joint swelling. Negative for neck pain and neck stiffness.  Skin: Negative for wound.  Neurological: Negative for dizziness, syncope, weakness, light-headedness, numbness and headaches.     Physical Exam Updated Vital Signs There were no vitals taken for this visit.  Physical Exam  Constitutional: She appears well-developed and well-nourished. No distress.  Eyes: Right eye exhibits no discharge. Left eye exhibits no discharge. No scleral icterus.  Cardiovascular: Intact distal pulses.   Pulmonary/Chest: No respiratory distress.    Musculoskeletal: She exhibits edema and tenderness. She exhibits no deformity.       Left wrist: She exhibits decreased range of motion, tenderness, bony tenderness, swelling and crepitus. She exhibits no effusion, no deformity and no laceration.  Radial pulses are 2+ bilaterally. Sensation intact. Cap refill normal. Patient able to move her left fingers any difficulties. Full range of motion left elbow and left shoulder without any pain. No obvious deformity noted. No ecchymosis noted. Mild edema.  Neurological: She is alert.  Skin: Skin is warm and dry. Capillary refill takes less than 2 seconds. No pallor.  Nursing note and vitals reviewed.    ED Treatments / Results  Labs (all labs ordered are listed, but only abnormal results are displayed) Labs Reviewed - No data to display  EKG  EKG Interpretation None       Radiology Dg Wrist Complete Left  Result Date: 08/09/2016 CLINICAL DATA:  Patient states that she fell last night on a curb with her left hand outstretched trying to catch herself, has left lateral wrist pains with swelling, EXAM: LEFT WRIST - COMPLETE 3+ VIEW COMPARISON:  None. FINDINGS: Four views of the left wrist submitted. Study is limited by diffuse osteopenia. There is mild impacted nondisplaced fracture in distal left radial metaphysis. The alignment is preserved. Degenerative changes are noted first carpometacarpal joint. IMPRESSION: Mild impacted nondisplaced fracture in distal left radial metaphysis. Limited study by diffuse osteopenia. Electronically Signed   By: Lahoma Crocker M.D.   On: 08/09/2016 11:28    Procedures Procedures (including critical care time)  Medications Ordered in ED Medications  acetaminophen (TYLENOL) tablet 650 mg (650 mg Oral Given 08/09/16 1226)     Initial Impression / Assessment and Plan / ED Course  I have reviewed the triage vital signs and the nursing notes.  Pertinent labs & imaging results that were available during my care of  the patient were reviewed by me and considered in my medical decision making (see chart for details).     Patient presents to the ED with complaints of left wrist pain after mechanical fall last night while walking her dog. Denies any LOC or head injury. X-ray shows mild impacted nondisplaced fracture of the distal left radius. Patient is neurovascularly intact with good grip strength. Will place patient in sugar tong splint and sling and follow up with hand surgery. Patient reassessed after splint was placed and continues to be neurovascularly intact. We'll give follow-up referral. Patient verbalized understanding the plan of care. Vital signs are stable. Patient discussed with Dr. Lanny Cramp who is agreeable to above plan. All questions answered prior to discharge.  SPLINT APPLICATION Date/Time: 15:05 PM Authorized by: Ocie Cornfield Consent: Verbal consent obtained. Risks and benefits: risks, benefits and alternatives were discussed Consent given by: patient Splint applied by: orthopedic technician Location details: Left wrist  Splint type: Sugar tong  Supplies used: Fiberglass material, Ace wrap, sling  Post-procedure: The splinted body part was neurovascularly unchanged following the procedure. Patient tolerance: Patient tolerated the procedure well with no immediate complications.     Final Clinical Impressions(s) / ED Diagnoses   Final diagnoses:  Closed fracture of distal end of left radius, unspecified fracture morphology, initial encounter    New Prescriptions New Prescriptions   No medications on file     Doristine Devoid, PA-C 08/09/16 1241    Veryl Speak, MD 08/09/16 1302

## 2016-08-09 NOTE — Discharge Instructions (Signed)
You do have a fracture of your left wrist. Please wear the splint at all times. Tylenol for pain. Please call the orthopedist once discharged to schedule an appointment. Please rest, ice, elevate the left wrist.

## 2016-08-22 ENCOUNTER — Encounter: Payer: Medicare Other | Admitting: Adult Health

## 2016-08-23 ENCOUNTER — Telehealth: Payer: Self-pay | Admitting: Hematology and Oncology

## 2016-08-23 ENCOUNTER — Ambulatory Visit (HOSPITAL_BASED_OUTPATIENT_CLINIC_OR_DEPARTMENT_OTHER): Payer: Medicare Other | Admitting: Adult Health

## 2016-08-23 ENCOUNTER — Encounter: Payer: Self-pay | Admitting: Adult Health

## 2016-08-23 ENCOUNTER — Encounter: Payer: Medicare Other | Admitting: Adult Health

## 2016-08-23 VITALS — BP 126/78 | HR 67 | Temp 98.3°F | Resp 20 | Ht 64.0 in | Wt 123.3 lb

## 2016-08-23 DIAGNOSIS — C50412 Malignant neoplasm of upper-outer quadrant of left female breast: Secondary | ICD-10-CM

## 2016-08-23 DIAGNOSIS — Z170 Estrogen receptor positive status [ER+]: Secondary | ICD-10-CM

## 2016-08-23 NOTE — Telephone Encounter (Signed)
Gave patient avs report and appointments for October  °

## 2016-08-23 NOTE — Progress Notes (Signed)
CLINIC:  Survivorship   REASON FOR VISIT:  Routine follow-up post-treatment for a recent history of breast cancer.  BRIEF ONCOLOGIC HISTORY:    Breast cancer of upper-outer quadrant of left female breast ([place])   11/10/2015 Initial Diagnosis    Left breast biopsy 2:00, 1.5 x 0.7 x 0.7 cm : Grade 1, IDC with ADH ER 100%, PR 0%, Ki-67 10%, HER-2 neg ratio 1.28; 12:00: 2.8 cm Inv mamm ca grade 1-2,  ER 90%, PR 30%, Ki-67 10%, HER-2 neg ratio 1.15; total size 4.8 cm, T2 N0 stage II a clinical stage      11/26/2015 Genetic Testing    Genetic testing was normal, and did not reveal a deleterious mutation in these genes.  Additionally, no variants of uncertain significance (VUSes) were found. Genes tested include: ATM, BARD1, BRCA1, BRCA2, BRIP1, CDH1, CHEK2, FANCC, MLH1, MSH2, MSH6, NBN, PALB2, PMS2, PTEN, RAD51C, RAD51D, TP53, and XRCC2.  This panel also includes deletion/duplication analysis (without sequencing) for one gene, EPCAM.       12/18/2015 Surgery    Simple mastectomy Dalbert Batman): Invasive tubulolobular carcinoma, grade 1, 3.6 cm, low-grade DCIS, LCIS, ER 100% and 90%; PR 0% and 30%, HER-2 negative, Ki-67 10%, T2 NX stage II a       12/29/2015 Oncotype testing    Oncotype score 26, 17% ROR (intermediate)      01/2016 -  Anti-estrogen oral therapy    Anastrozole 17m daily      02/22/2016 Imaging    CT abdomen pelvis to evaluate pancreatic lesion: Less conspicuous uncinate process of pancreas lesion measuring 8 x 4 mm previously (11 x 8 mm)       INTERVAL HISTORY:  Ms. [facility]iessp[facility] to [facility] for our initial meeting to review her survivorship care plan detailing her treatment course for breast cancer, as well as monitoring long-term side effects of that treatment, education regarding health maintenance, screening, and overall wellness and health promotion.     Overall, Ms. SKistlerreports feeling quite well since finishing up with her treatment.  She is  taking Anastrozole daily and is tolerating it well.  She isn't sure if she is going to have a romantic partner in the future, but would like more information on vaginal dryness and its possible impact if she does decide to pursue a romantic relationship.      REVIEW OF SYSTEMS:  Review of Systems  Constitutional: Negative for chills, fever and malaise/fatigue.  HENT: Negative for hearing loss and tinnitus.   Eyes: Negative for blurred vision and double vision.  Respiratory: Negative for cough and shortness of breath.   Cardiovascular: Negative for chest pain, palpitations and leg swelling.  Gastrointestinal: Negative for abdominal pain, blood in stool, constipation, diarrhea, heartburn, melena, nausea and vomiting.  Skin: Negative for rash.  Neurological: Negative for dizziness, focal weakness and headaches.  Endo/Heme/Allergies: Negative for environmental allergies. Does not bruise/bleed easily.  Psychiatric/Behavioral: Negative for depression. The patient is not nervous/anxious.   Breast: Denies any new nodularity, masses, tenderness, nipple changes, or nipple discharge.       ONCOLOGY TREATMENT TEAM:  1. [facility] 2. Medical Oncologist: Dr. GLindi Adie     PAST MEDICAL/SURGICAL HISTORY:  Past Medical History:  Diagnosis Date  . Anxiety    PMH of  . [place]   [place] breast-lumpectomy, chemo  . [place]    breast, skin - basal cell and squamous cell  .  Hypertension    Past Surgical History:  Procedure Laterality Date  . BREAST LUMPECTOMY  1995   radiation w/o chemotherapy  . COLONOSCOPY  2007 & 2012   negative x 2 , Dr  Laurence Spates  . lymph node resection  1995   17 LN negative  . TONSILLECTOMY AND ADENOIDECTOMY    . TOTAL MASTECTOMY Left 12/18/2015   Procedure: LEFT TOTAL MASTECTOMY;  Surgeon: Fanny Skates, MD;  Location: [place];  Service: General;  Laterality: Left;     ALLERGIES:    Allergies  Allergen Reactions  . Codeine     REACTION: feels like spiders crawling on skin     CURRENT MEDICATIONS:  Outpatient Encounter Prescriptions as of 08/23/2016  Medication Sig  . anastrozole (ARIMIDEX) 1 MG tablet Take 1 tablet (1 mg total) by mouth daily.  . cholecalciferol (VITAMIN D) 1000 units tablet Take 1,000 Units by mouth daily.  . Cinnamon 500 MG TABS Take by mouth.  . dexamethasone (DECADRON) 4 MG tablet Take 1 tablet (4 mg total) by mouth daily. Take 1 tablet daily for chemotherapy in 1 tablet daily after  . HYDROcodone-acetaminophen (NORCO/VICODIN) 5-325 MG tablet Take 1-2 tablets by mouth every 4 (four) hours as needed for moderate pain.  Marland Kitchen lidocaine-prilocaine (EMLA) cream Apply to affected area once  . LORazepam (ATIVAN) 0.5 MG tablet Take 1 tablet (0.5 mg total) by mouth at bedtime.  Marland Kitchen losartan-hydrochlorothiazide (HYZAAR) 100-25 MG per tablet TAKE 1 TABLET BY MOUTH EVERY DAY  . methocarbamol (ROBAXIN) 500 MG tablet Take 1 tablet (500 mg total) by mouth every 6 (six) hours as needed for muscle spasms.  Marland Kitchen MILK THISTLE PO Take by mouth daily.  . ondansetron (ZOFRAN) 8 MG tablet Take 1 tablet (8 mg total) by mouth 2 (two) times daily as needed for refractory nausea / vomiting. Start on day 3 after chemo.  . prochlorperazine (COMPAZINE) 10 MG tablet Take 1 tablet (10 mg total) by mouth every 6 (six) hours as needed (Nausea or vomiting).  . vitamin C (ASCORBIC ACID) 500 MG tablet Take 500 mg by mouth daily.   No facility-administered encounter medications on file as of 08/23/2016.      ONCOLOGIC FAMILY HISTORY:  Family History  Problem Relation Age of Onset  . Osteoarthritis Mother   . Breast cancer Mother 83  . Hyperlipidemia Mother   . Hypertension Mother   . Skin cancer Mother     dx squamous and basal cell carcinomas; +sun exposure  . Hypertension Father   . Diabetes Father     diet controlled  . Dementia Father   . Skin cancer Father     hx basal  cell carcinoma  . Hypertension Sister   . Other Sister 59    hx of hysterectomy for unspecified reason  . Colon cancer Paternal Grandmother 27  . Other Maternal Grandfather     blood or kidney disease; d. 93  . Stroke Paternal Grandfather 64  . Skin cancer Daughter     basal or squamous type  . Skin cancer Daughter     basal or squamous type  . Skin cancer Daughter     basal or squamous type  . Breast cancer Cousin 61    paternal 1st cousin  . Heart disease Neg Hx      GENETIC COUNSELING/TESTING: 11/26/2015: Genetic testing was normal, and did not reveal a deleterious mutation in these genes.  Additionally, no variants of uncertain significance (VUSes) were found. Genes tested  include: ATM, BARD1, BRCA1, BRCA2, BRIP1, CDH1, CHEK2, FANCC, MLH1, MSH2, MSH6, NBN, PALB2, PMS2, PTEN, RAD51C, RAD51D, TP53, and XRCC2.  This panel also includes deletion/duplication analysis (without sequencing) for one gene, EPCAM.   SOCIAL HISTORY:  NYAISHA SIMAO is widowed and lives alone, [place].  Ms. Quinteros is currently retired.  She denies any current or history of tobacco, alcohol, or illicit drug use.     PHYSICAL EXAMINATION:  Vital Signs:   Vitals:   08/23/16 1350  BP: 126/78  Pulse: 67  Resp: 20  Temp: 98.3 F (36.8 C)   Filed Weights   08/23/16 1350  Weight: 123 lb 4.8 oz (55.9 kg)   General: Well-nourished, well-appearing female in no acute distress.  She is accompanied by her granddaughter.   HEENT: Head is normocephalic.  Pupils equal and reactive to light. Conjunctivae clear without exudate.  Sclerae anicteric. Oral mucosa is pink, moist.  Oropharynx is pink without lesions or erythema.  Lymph: No cervical, supraclavicular, or infraclavicular lymphadenopathy noted on palpation.  Cardiovascular: Regular rate and rhythm.Marland Kitchen Respiratory: Clear to auscultation bilaterally. Chest expansion symmetric; breathing non-labored.  Breasts: right breast without nodules, masses, skin or  nipple changes, left breast s/p mastectomy, no nodules, masses, skin changes noted, no tenderness, no sign of recurrence, benign bilateral breast exam.  GI: Abdomen soft and round; non-tender, non-distended. Bowel sounds normoactive.  GU: Deferred.  Neuro: No focal deficits. Steady gait.  Psych: Mood and affect normal and appropriate for situation.  Extremities: No edema. Skin: Warm and dry.  LABORATORY DATA:  None for this visit.  DIAGNOSTIC IMAGING:  None for this visit.      ASSESSMENT AND PLAN:  Ms.. Porr is a pleasant 71 y.o. female with Stage IIA left breast invasive ductal carcinoma, ER+/PR+/HER2-, diagnosed in 10/2015, treated with mastectomy and anti-estrogen therapy with Anastrozole beginning in 01/2016.  She presents to the Survivorship Clinic for our initial meeting and routine follow-up post-completion of treatment for breast cancer.    1. Stage IIA left breast cancer:  Ms. Easler is continuing to recover from definitive treatment for breast cancer. She will follow-up with her medical oncologist, Dr. Lindi Adie in six months with history and physical exam per surveillance protocol.  She will continue her anti-estrogen therapy with Anastrozole which she is tolerating very well.Today, a comprehensive survivorship care plan and treatment summary was reviewed with the patient today detailing her breast cancer diagnosis, treatment course, potential late/long-term effects of treatment, appropriate follow-up care with recommendations for the future, and patient education resources.  A copy of this summary, along with a letter will be sent to the patient's primary care provider via mail/fax/In Basket message after today's visit.    2. Vaginal dryness concern: I reviewed her concern with her and reviewed the use of vaginal lubrication or moisturizers in future if needed, however am concerned for ? Vaginal atrophy if she is indeed suffering from vaginal dryness now.  We discussed and reviewed  vaginal dilation and prevention of dyspareunia.    3. Bone health:  Given Ms. Schachter's age/history of breast cancer and her current treatment regimen including anti-estrogen therapy with Anastrozole, she is at risk for bone demineralization.  She refuses DEXA scan since she will not take bisphosphanates she doesn't see a point.  She is aware of the risk of Anastrozole in regards to decreasing the density of her bones.  She was encouraged to increase her consumption of foods rich in calcium, as well as increase her weight-bearing activities.  She was given education on specific activities to promote bone health.  Should she have more than one fracture, she may require consideration of Tamoxifen instead of Anastrozole in the future.    4. Cancer screening:  Due to Ms. Stagliano's history and her age, she should receive screening for skin cancers, colon cancer, and gynecologic cancers.  The information and recommendations are listed on the patient's comprehensive care plan/treatment summary and were reviewed in detail with the patient.    5. Health maintenance and wellness promotion: Ms. Brune was encouraged to consume 5-7 servings of fruits and vegetables per day. We reviewed the "Nutrition Rainbow" handout, as well as the handout "Take Control of Your Health and Reduce Your Cancer Risk" from the [place].  She was also encouraged to engage in moderate to vigorous exercise for 30 minutes per day most days of the week. We discussed the [employer] fitness program, which is designed for cancer survivors to help them become more physically fit after cancer treatments.  She was instructed to limit her alcohol consumption and continue to abstain from tobacco use.     6. Support services/counseling: It is not uncommon for this period of the patient's cancer care trajectory to be one of many emotions and stressors.  We discussed an opportunity for her to participate in the next session of [facility] ("Finding  Your New Normal") support group series designed for patients after they have completed treatment.   Ms. Hickam was encouraged to take advantage of our many other support services programs, support groups, and/or counseling in coping with her new life as a cancer survivor after completing anti-cancer treatment.  She was offered support today through active listening and expressive supportive counseling.  She was given information regarding our available services and encouraged to contact me with any questions or for help enrolling in any of our support group/programs.    Dispo:   -Return to cancer center in 6 months for follow up with Dr. Lindi Adie -She is welcome to return back to the Survivorship Clinic at any time; no additional follow-up needed at this time.  -Consider referral back to survivorship as a long-term survivor for continued surveillance  A total of (30) minutes of face-to-face time was spent with this patient with greater than 50% of that time in counseling and care-coordination.   Gardenia Phlegm, NP Survivorship Program [facility] [phone]   Note: PRIMARY CARE PROVIDER Marjorie Smolder, [place] [phone]

## 2016-08-29 ENCOUNTER — Encounter: Payer: Medicare Other | Admitting: Adult Health

## 2016-11-12 ENCOUNTER — Other Ambulatory Visit: Payer: Self-pay | Admitting: Hematology and Oncology

## 2016-12-10 ENCOUNTER — Telehealth: Payer: Self-pay

## 2016-12-10 NOTE — Telephone Encounter (Signed)
Spoke with patient and she is aware of her new appt due to bmdc   Sherry Rice 

## 2017-01-03 ENCOUNTER — Other Ambulatory Visit: Payer: Self-pay | Admitting: Hematology and Oncology

## 2017-01-03 DIAGNOSIS — Z1231 Encounter for screening mammogram for malignant neoplasm of breast: Secondary | ICD-10-CM

## 2017-01-04 ENCOUNTER — Ambulatory Visit: Payer: Medicare Other

## 2017-01-09 ENCOUNTER — Ambulatory Visit
Admission: RE | Admit: 2017-01-09 | Discharge: 2017-01-09 | Disposition: A | Discharge status: Home / Self Care | Payer: Medicare Other | Source: Ambulatory Visit | Attending: Hematology and Oncology | Admitting: Hematology and Oncology

## 2017-01-09 DIAGNOSIS — Z1231 Encounter for screening mammogram for malignant neoplasm of breast: Secondary | ICD-10-CM

## 2017-01-11 ENCOUNTER — Other Ambulatory Visit: Payer: Self-pay | Admitting: Oncology

## 2017-01-11 ENCOUNTER — Other Ambulatory Visit: Payer: Self-pay | Admitting: Hematology and Oncology

## 2017-01-11 DIAGNOSIS — R928 Other abnormal and inconclusive findings on diagnostic imaging of breast: Secondary | ICD-10-CM

## 2017-01-13 ENCOUNTER — Ambulatory Visit
Admission: RE | Admit: 2017-01-13 | Discharge: 2017-01-13 | Disposition: A | Discharge status: Home / Self Care | Payer: Medicare Other | Source: Ambulatory Visit | Attending: Hematology and Oncology | Admitting: Hematology and Oncology

## 2017-01-17 ENCOUNTER — Other Ambulatory Visit: Payer: Medicare Other

## 2017-02-08 ENCOUNTER — Other Ambulatory Visit: Payer: Self-pay | Admitting: Hematology and Oncology

## 2017-02-20 ENCOUNTER — Telehealth: Payer: Self-pay | Admitting: Hematology and Oncology

## 2017-02-20 NOTE — Telephone Encounter (Signed)
Spoke with patient and rescheduled appt per Dr. Lindi Adie being out of the office.

## 2017-02-21 ENCOUNTER — Ambulatory Visit: Payer: Medicare Other | Admitting: Hematology and Oncology

## 2017-02-22 ENCOUNTER — Ambulatory Visit: Payer: Medicare Other | Admitting: Hematology and Oncology

## 2017-02-27 ENCOUNTER — Ambulatory Visit: Payer: Medicare Other | Admitting: Hematology and Oncology

## 2017-03-06 ENCOUNTER — Ambulatory Visit (HOSPITAL_BASED_OUTPATIENT_CLINIC_OR_DEPARTMENT_OTHER): Payer: Medicare Other | Admitting: Hematology and Oncology

## 2017-03-06 ENCOUNTER — Telehealth: Payer: Self-pay | Admitting: Hematology and Oncology

## 2017-03-06 DIAGNOSIS — C50412 Malignant neoplasm of upper-outer quadrant of left female breast: Secondary | ICD-10-CM | POA: Diagnosis not present

## 2017-03-06 DIAGNOSIS — Z170 Estrogen receptor positive status [ER+]: Secondary | ICD-10-CM | POA: Diagnosis not present

## 2017-03-06 MED ORDER — VITAMIN C 250 MG PO TABS: 250.0000 mg | ORAL_TABLET | Freq: Every day | ORAL | Status: DC

## 2017-03-06 MED ORDER — ANASTROZOLE 1 MG PO TABS: 1.0000 mg | ORAL_TABLET | Freq: Every day | ORAL | 3 refills | Status: DC

## 2017-03-06 MED ORDER — VITAMIN B-6 50 MG PO TABS: 50.0000 mg | ORAL_TABLET | Freq: Every day | ORAL | Status: AC

## 2017-03-06 NOTE — Progress Notes (Signed)
Patient Care Team: Darcus Austin, MD as PCP - General (Family Medicine) Laurence Spates, MD (Gastroenterology) Jari Pigg, MD (Dermatology) Delice Bison Charlestine Massed, NP as Nurse Practitioner (Hematology and Oncology) Fanny Skates, MD as Consulting Physician (General Surgery) Nicholas Lose, MD as Consulting Physician (Hematology and Oncology)  DIAGNOSIS:  Encounter Diagnosis  Name Primary?  . Malignant neoplasm of upper-outer quadrant of left breast in female, estrogen receptor positive ([place])     SUMMARY OF ONCOLOGIC HISTORY:   Breast cancer of upper-outer quadrant of left female breast (Sherry Rice)   11/10/2015 Initial Diagnosis    Left breast biopsy 2:00, 1.5 x 0.7 x 0.7 cm : Grade 1, IDC with ADH ER 100%, PR 0%, Ki-67 10%, HER-2 neg ratio 1.28; 12:00: 2.8 cm Inv mamm ca grade 1-2,  ER 90%, PR 30%, Ki-67 10%, HER-2 neg ratio 1.15; total size 4.8 cm, T2 N0 stage II a clinical stage      11/26/2015 Genetic Testing    Genetic testing was normal, and did not reveal a deleterious mutation in these genes.  Additionally, no variants of uncertain significance (VUSes) were found. Genes tested include: ATM, BARD1, BRCA1, BRCA2, BRIP1, CDH1, CHEK2, FANCC, MLH1, MSH2, MSH6, NBN, PALB2, PMS2, PTEN, RAD51C, RAD51D, TP53, and XRCC2.  This panel also includes deletion/duplication analysis (without sequencing) for one gene, EPCAM.       12/18/2015 Surgery    Simple mastectomy Sherry Rice): Invasive tubulolobular carcinoma, grade 1, 3.6 cm, low-grade DCIS, LCIS, ER 100% and 90%; PR 0% and 30%, HER-2 negative, Ki-67 10%, T2 NX stage II a       12/29/2015 Oncotype testing    Oncotype score 26, 17% ROR (intermediate)      01/2016 -  Anti-estrogen oral therapy    Anastrozole '1mg'$  daily      02/22/2016 Imaging    CT abdomen pelvis to evaluate pancreatic lesion: Less conspicuous uncinate process of pancreas lesion measuring 8 x 4 mm previously (11 x 8 mm)       CHIEF COMPLIANT: Follow-up on anastrozole  therapy  INTERVAL HISTORY: Sherry Rice is a 71 year old with above-mentioned history of left breast cancer currently on antiestrogen therapy with anastrozole.  She appears to be tolerating it fairly well without any problems or concerns.  Denies any lumps or nodules in the breast  REVIEW OF SYSTEMS:   Constitutional: Denies fevers, chills or abnormal weight loss Eyes: Denies blurriness of vision Ears, nose, mouth, throat, and face: Denies mucositis or sore throat Respiratory: Denies cough, dyspnea or wheezes Cardiovascular: Denies palpitation, chest discomfort Gastrointestinal:  Denies nausea, heartburn or change in bowel habits Skin: Denies abnormal skin rashes Lymphatics: Denies new lymphadenopathy or easy bruising Neurological:Denies numbness, tingling or new weaknesses Behavioral/Psych: Mood is stable, no new changes  Extremities: No lower extremity edema Breast:  denies any pain or lumps or nodules in either breasts All other systems were reviewed with the patient and are negative.  I have reviewed the past medical history, past surgical history, social history and family history with the patient and they are unchanged from previous note.  ALLERGIES:  is allergic to codeine.  MEDICATIONS:  Current Outpatient Medications  Medication Sig Dispense Refill  . anastrozole (ARIMIDEX) 1 MG tablet Take 1 tablet (1 mg total) daily by mouth. 90 tablet 3  . cholecalciferol (VITAMIN D) 1000 units tablet Take 1,000 Units by mouth daily.    Marland Kitchen losartan-hydrochlorothiazide (HYZAAR) 100-25 MG per tablet TAKE 1 TABLET BY MOUTH EVERY DAY 90 tablet 1  . MILK  THISTLE PO Take by mouth daily.    Marland Kitchen pyridOXINE (VITAMIN B-6) 50 MG tablet Take 1 tablet (50 mg total) daily by mouth.    . vitamin C (ASCORBIC ACID) 250 MG tablet Take 1 tablet (250 mg total) daily by mouth.     No current facility-administered medications for this visit.     PHYSICAL EXAMINATION: ECOG PERFORMANCE STATUS: 1 -  Symptomatic but completely ambulatory  Vitals:   03/06/17 1435  BP: 128/84  Pulse: 64  Resp: 17  Temp: 97.8 F (36.6 C)  SpO2: 100%   Filed Weights   03/06/17 1435  Weight: 121 lb (54.9 kg)    GENERAL:alert, no distress and comfortable SKIN: skin color, texture, turgor are normal, no rashes or significant lesions EYES: normal, Conjunctiva are pink and non-injected, sclera clear OROPHARYNX:no exudate, no erythema and lips, buccal mucosa, and tongue normal  NECK: supple, thyroid normal size, non-tender, without nodularity LYMPH:  no palpable lymphadenopathy in the cervical, axillary or inguinal LUNGS: clear to auscultation and percussion with normal breathing effort HEART: regular rate & rhythm and no murmurs and no lower extremity edema ABDOMEN:abdomen soft, non-tender and normal bowel sounds MUSCULOSKELETAL:no cyanosis of digits and no clubbing  NEURO: alert & oriented x 3 with fluent speech, no focal motor/sensory deficits EXTREMITIES: No lower extremity edema BREAST: No palpable masses or nodules in either right or left breasts. No palpable axillary supraclavicular or infraclavicular adenopathy no breast tenderness or nipple discharge. (exam performed in the presence of a chaperone)  LABORATORY DATA:  I have reviewed the data as listed   Chemistry      Component Value Date/Time   NA 141 02/26/2016 0859   K 4.9 02/26/2016 0859   CL 103 04/16/2013 1732   CO2 28 02/26/2016 0859   BUN 12.9 02/26/2016 0859   CREATININE 0.7 02/26/2016 0859      Component Value Date/Time   CALCIUM 9.7 02/26/2016 0859   ALKPHOS 85 02/26/2016 0859   AST 27 02/26/2016 0859   ALT 23 02/26/2016 0859   BILITOT 0.55 02/26/2016 0859       Lab Results  Component Value Date   WBC 10.0 02/26/2016   HGB 15.1 02/26/2016   HCT 45.0 02/26/2016   MCV 103.6 (H) 02/26/2016   PLT 216 02/26/2016   NEUTROABS 5.4 02/26/2016    ASSESSMENT & PLAN:  Breast cancer of upper-outer quadrant of left  female breast (Sherry Rice) 11/10/2015: Left breast biopsy 2:00, 1.5 x 0.7 x 0.7 cm : Grade 1, IDC with ADH ER 100%, PR 0%, Ki-67 10%, HER-2 neg ratio 1.28; 12:00: 2.8 cm Inv mamm ca grade 1-2, ER 90%, PR 30%, Ki-67 10%, HER-2 neg ratio 1.15; total size 4.8 cm, T2 N0 stage II a clinical stage  12/18/2015: Simple mastectomy: Invasive tubulolobular carcinoma, grade 1, 3.6 cm, low-grade DCIS, LCIS, ER 100% and 90%; PR 0% and 30%, HER-2 negative, Ki-67 10%, T2 NX stage II a   Oncotype DX score 26, 17% risk of recurrence with tamoxifen alone (intermediate risk) CT abdomen pelvis 02/26/2016: No evidence of metastatic disease  Treatment plan:  1. Adjuvant systemic chemotherapy with Taxotere and Cytoxan every 3 weeks 4 (patient has not finalized her decision regarding chemotherapy) 2. followed by adjuvant antiestrogen therapy with anastrozole 5-10 years (started September 2017) --------------------------------------------------------------------------------------------------------------------------------------------------------------- Anastrozole toxicities: Denies any hot flashes or myalgias. She is tolerating extremely well.  Surveillance: 1.  Breast exam 03/06/2017: Benign 2. Mammogram and ultrasound 01/13/2017: No evidence of malignancy multiple benign cysts.  Renewed  her prescription for anastrozole. Return to clinic in 1 year for follow-up  I spent 25 minutes talking to the patient of which more than half was spent in counseling and coordination of care.  No orders of the defined types were placed in this encounter.  The patient has a good understanding of the overall plan. she agrees with it. she will call with any problems that may develop before the next visit here.   Rulon Eisenmenger, MD 03/06/17

## 2017-03-06 NOTE — Telephone Encounter (Signed)
Gave patient avs report and appointments for November 2019 °

## 2017-03-06 NOTE — Assessment & Plan Note (Signed)
11/10/2015: Left breast biopsy 2:00, 1.5 x 0.7 x 0.7 cm : Grade 1, IDC with ADH ER 100%, PR 0%, Ki-67 10%, HER-2 neg ratio 1.28; 12:00: 2.8 cm Inv mamm ca grade 1-2, ER 90%, PR 30%, Ki-67 10%, HER-2 neg ratio 1.15; total size 4.8 cm, T2 N0 stage II a clinical stage  12/18/2015: Simple mastectomy: Invasive tubulolobular carcinoma, grade 1, 3.6 cm, low-grade DCIS, LCIS, ER 100% and 90%; PR 0% and 30%, HER-2 negative, Ki-67 10%, T2 NX stage II a   Oncotype DX score 26, 17% risk of recurrence with tamoxifen alone (intermediate risk) CT abdomen pelvis 02/26/2016: No evidence of metastatic disease  Treatment plan:  1. Adjuvant systemic chemotherapy with Taxotere and Cytoxan every 3 weeks 4 (patient has not finalized her decision regarding chemotherapy) 2. followed by adjuvant antiestrogen therapy with anastrozole 5-10 years (started September 2017) --------------------------------------------------------------------------------------------------------------------------------------------------------------- Anastrozole toxicities: Denies any hot flashes or myalgias. She is tolerating extremely well.  Surveillance: 1.  Breast exam 03/06/2017: Benign 2. Mammogram and ultrasound 01/13/2017: No evidence of malignancy multiple benign cysts.  Return to clinic in 1 year for follow-up

## 2017-05-09 ENCOUNTER — Other Ambulatory Visit: Payer: Self-pay | Admitting: Hematology and Oncology

## 2017-07-21 ENCOUNTER — Other Ambulatory Visit: Payer: Self-pay | Admitting: Family Medicine

## 2017-07-21 DIAGNOSIS — Z720 Tobacco use: Secondary | ICD-10-CM

## 2017-11-08 IMAGING — MR MR BREAST BILAT WO/W CM
8 of 12 series · 31 of 48 positions shown · IV contrast (11 ml Multihance)
Comparison: Recent screening breast tomography 10/27/2015,
diagnostic mammogram and ultrasound of the left breast 11/09/2015
and ultrasound-guided biopsies and post clip mammogram of the left
breast 11/10/2015.

CLINICAL DATA: 70-year-old patient has a history of remote left
breast cancer, diagnosed in 1999, status post lumpectomy and
radiation therapy. Last month, in October 2015, she was diagnosed with
grade 1 invasive ductal carcinoma in the left breast at the site of
a mass at 2 o'clock position and grade 1-2 invasive mammary
carcinoma in the left breast in the 12 o'clock position.

LABS:  None obtained
EXAM:
BILATERAL BREAST MRI WITH AND WITHOUT CONTRAST
TECHNIQUE: Multiplanar, multisequence MR images of both breasts were obtained
prior to and following the intravenous administration of 11 ml of
MultiHance.

[Series 2: t2_tirm_tra ipat (a-p) · axial · 3.0mm · 0.70mm/px · 1 of 55 slices shown]
[im 1/55]
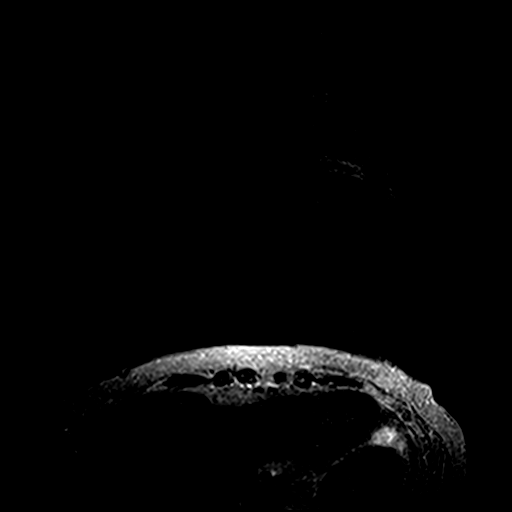

[Series 3: fl3d pre-cm no · axial · non-contrast · 1.2mm · 0.94mm/px · z∈[-69,+103]mm · 5 of 144 slices shown]
[im 1/144]
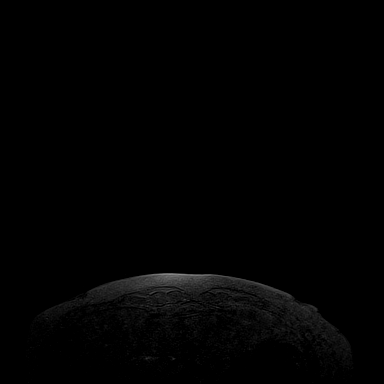
[im 36/144]
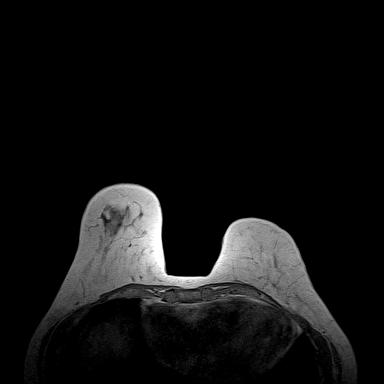
[im 72/144]
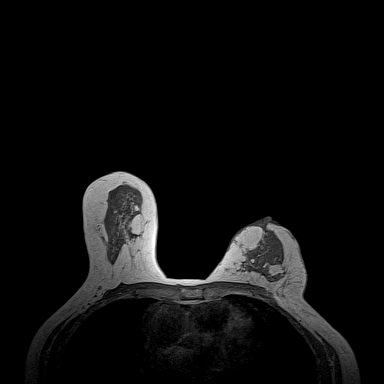
[im 108/144]
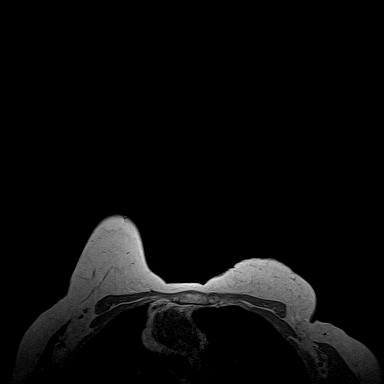
[im 144/144]
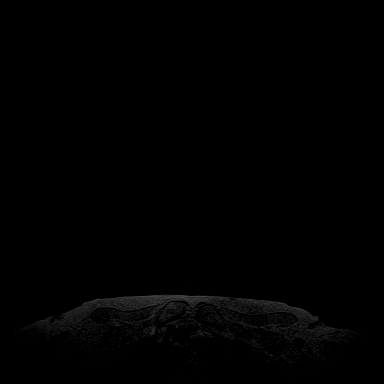

[Series 4: fl3d pre-cm · axial · non-contrast · 1.2mm · 0.94mm/px · z∈[-72,+100]mm · 5 of 144 slices shown]
[im 1/144]
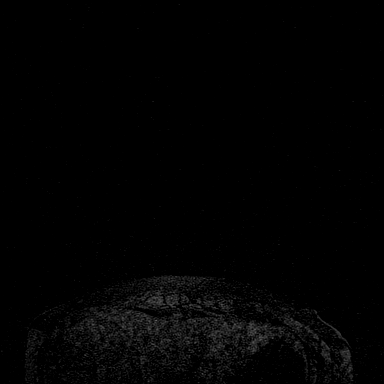
[im 36/144]
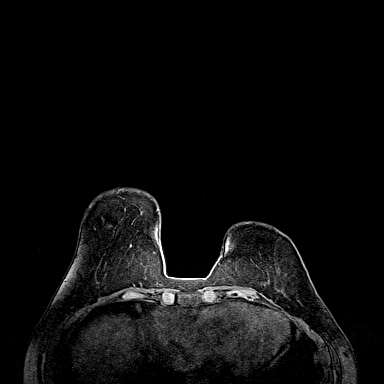
[im 72/144]
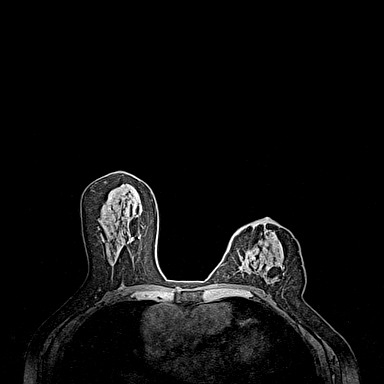
[im 108/144]
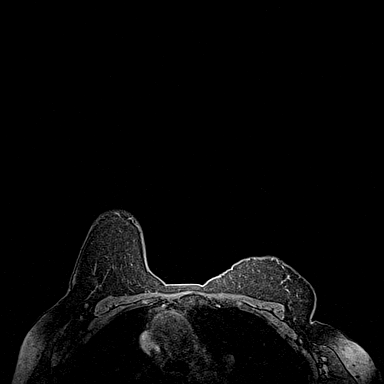
[im 144/144]
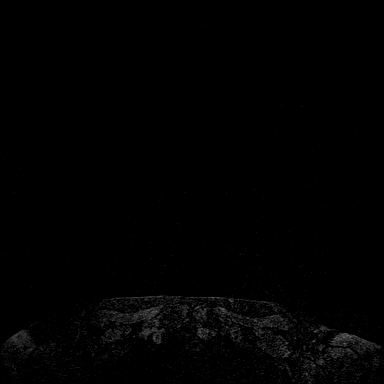

[Series 5: fl3d post-cm 20 · axial · 1.2mm · 0.94mm/px · z∈[-72,+100]mm · 5 of 144 slices shown (1 of 3)]
[im 1/144]
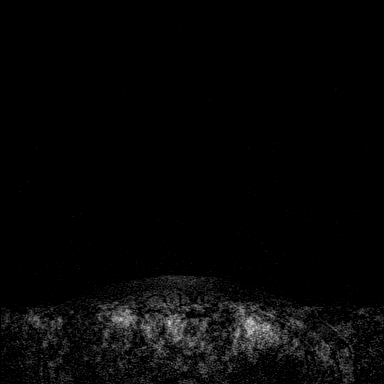
[im 36/144]
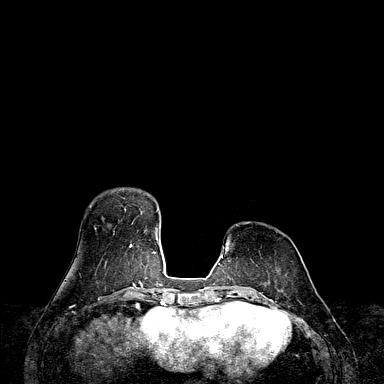
[im 72/144]
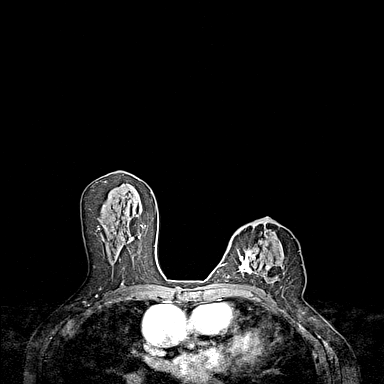
[im 108/144]
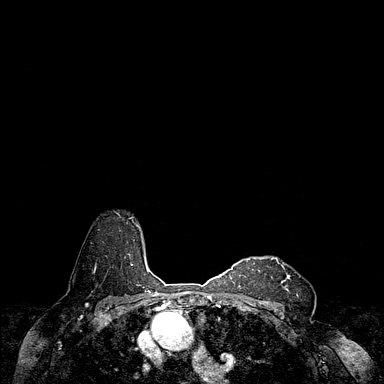
[im 144/144]
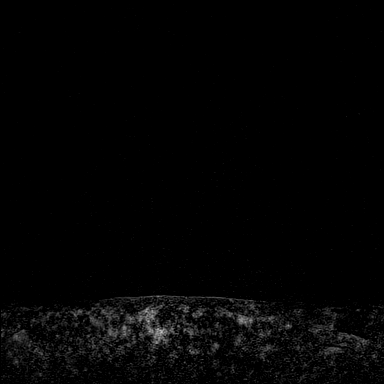

[Series 6: fl3d post-cm 20 · axial · 1.2mm · 0.94mm/px · z∈[-72,+100]mm · 5 of 144 slices shown (2 of 3)]
[im 1/144]
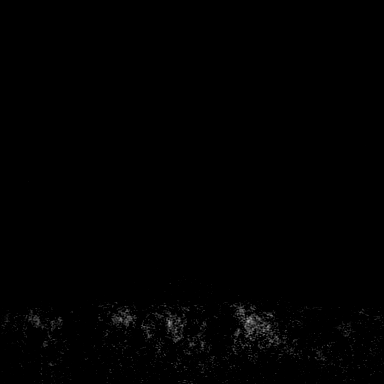
[im 36/144]
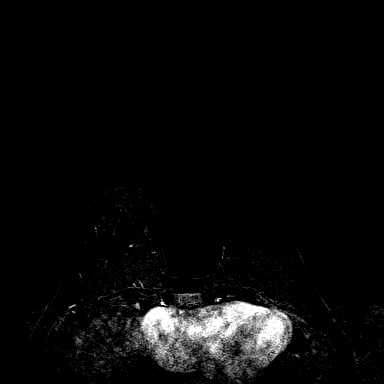
[im 72/144]
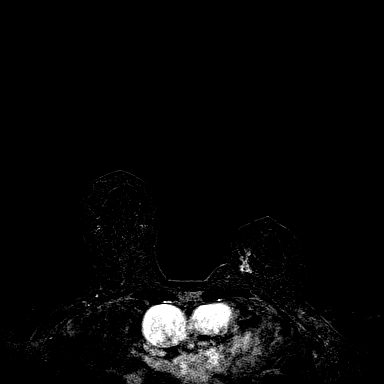
[im 108/144]
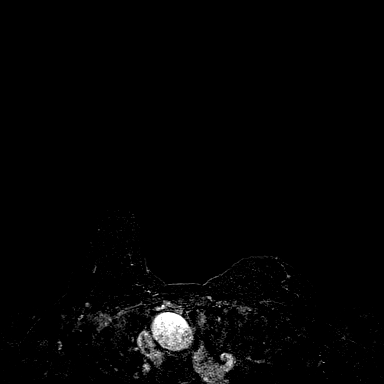
[im 144/144]
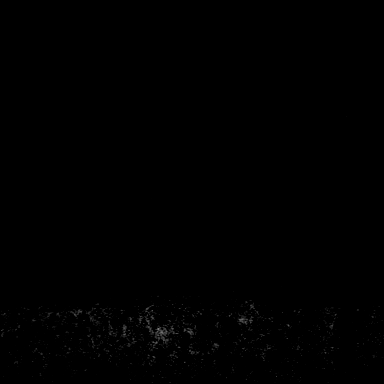

[Series 7: fl3d post-cm 20 · axial · 172.8mm · 0.94mm/px · 1 of 1 slices shown (3 of 3)]
[im 1/1]
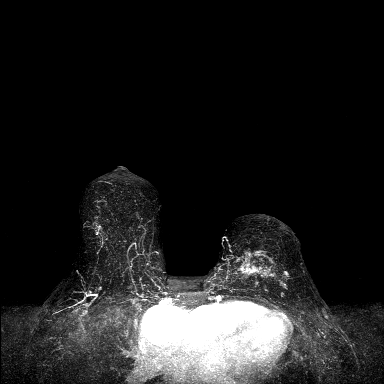

[Series 8: fl3d post-cm 3min · axial · 1.2mm · 0.94mm/px · z∈[-72,+100]mm · 6 of 144 slices shown]
[im 1/144]
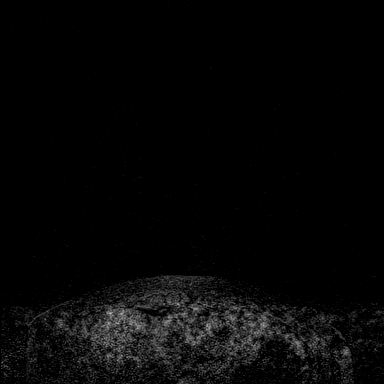
[im 29/144]
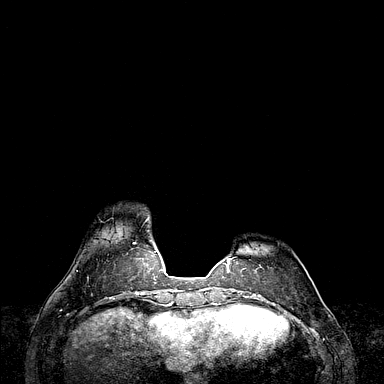
[im 58/144]
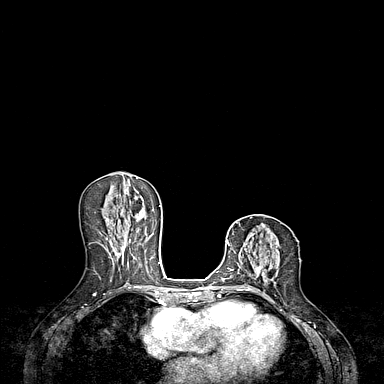
[im 86/144]
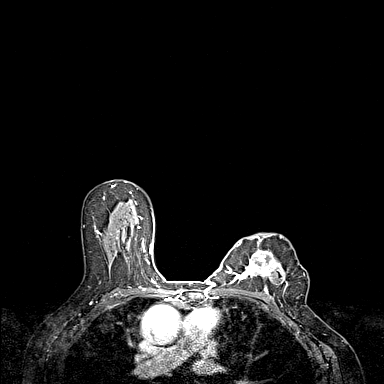
[im 115/144]
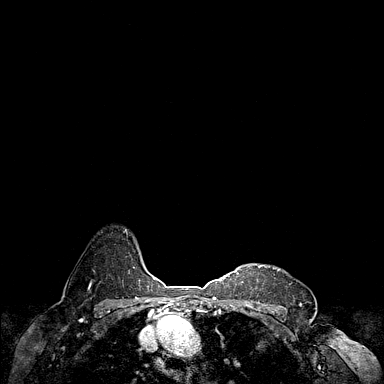
[im 144/144]
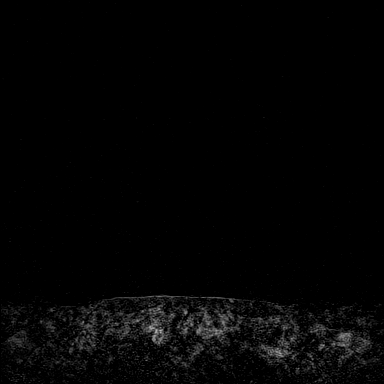

[Series 9: fl3d post-cm 3min_sub · axial · 1.2mm · 0.94mm/px · z∈[-72,-3]mm · 3 of 144 slices shown]
[im 1/144]
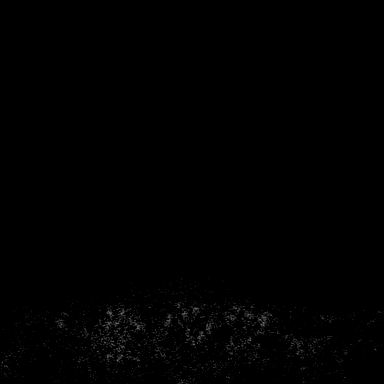
[im 29/144]
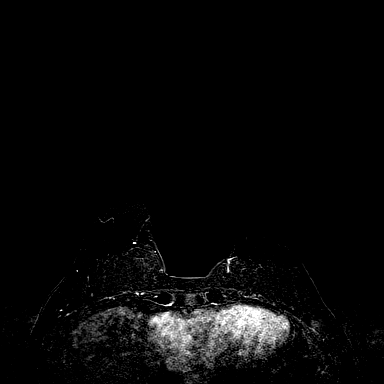
[im 58/144]
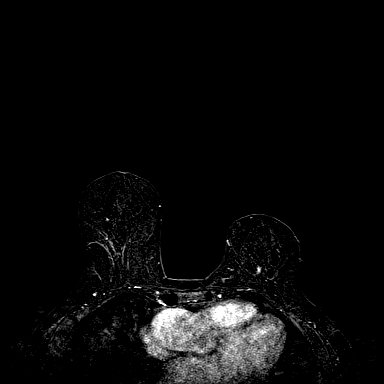

[31 of 48 positions shown; findings below may reference images not displayed]

THREE-DIMENSIONAL MR IMAGE RENDERING ON INDEPENDENT WORKSTATION:

Three-dimensional MR images were rendered by post-processing of the
original MR data on an independent workstation. The
three-dimensional MR images were interpreted, and findings are
reported in the following complete MRI report for this study. Three
dimensional images were evaluated at the independent [employer]
workstation
FINDINGS: Breast composition: c. Heterogeneous fibroglandular tissue.

Background parenchymal enhancement: Mild

Right breast: No mass or abnormal enhancement.

Left breast: The left breast is smaller than the right compatible
with the history of prior lumpectomy.

In the posterior third of the 2 o'clock position of the left breast
is an irregular enhancing mass with associated biopsy clip artifact.
This mass measures 1.0 x 0.8 x 1.0 cm on MRI.

Centered in the 12 o'clock of region of the left breast and
extending into the upper inner quadrant is an irregular enhancing
mass that measures 3.0 cm transverse diameter by 2.2 cm AP diameter
by 2.9 cm craniocaudal span. A biopsy clip is associated with the
portion of this mass in the 12 o'clock position of the left breast.

More inferiorly, in the central left breast, posterior third, is an
irregular enhancing mass measuring 0.8 x 0.6 x 0.8 cm.

There is slight skin thickening of the left breast, which could be
related to prior radiation therapy.

Lymph nodes: No abnormal appearing lymph nodes.

Ancillary findings:  None.
IMPRESSION: Multifocal biopsy proven carcinoma in the left breast. Biopsies were
performed in the 12 o'clock and 2 o'clock positions of the left
breast. The suspicious enhancement associated with the irregular 12
o'clock position mass extends medially into the upper inner quadrant
as well. Additionally, there is a separate 0.8 cm suspicious
enhancing mass in the central left breast, posterior third, which
has not been biopsied.

No MRI evidence of malignancy in the right breast.

Negative for lymphadenopathy.

RECOMMENDATION:
Treatment planning for known left breast cancer.

BI-RADS CATEGORY  6: Known biopsy-proven malignancy.

## 2018-01-24 ENCOUNTER — Other Ambulatory Visit: Payer: Self-pay | Admitting: Hematology and Oncology

## 2018-01-24 DIAGNOSIS — Z1231 Encounter for screening mammogram for malignant neoplasm of breast: Secondary | ICD-10-CM

## 2018-02-22 ENCOUNTER — Ambulatory Visit
Admission: RE | Admit: 2018-02-22 | Discharge: 2018-02-22 | Disposition: A | Discharge status: Home / Self Care | Payer: Medicare Other | Source: Ambulatory Visit | Attending: Hematology and Oncology | Admitting: Hematology and Oncology

## 2018-02-22 DIAGNOSIS — Z1231 Encounter for screening mammogram for malignant neoplasm of breast: Secondary | ICD-10-CM

## 2018-02-22 HISTORY — DX: Personal history of irradiation: Z92.3

## 2018-03-05 ENCOUNTER — Other Ambulatory Visit: Payer: Self-pay

## 2018-03-05 ENCOUNTER — Telehealth: Payer: Self-pay | Admitting: Hematology and Oncology

## 2018-03-05 ENCOUNTER — Inpatient Hospital Stay: Payer: Medicare Other | Attending: Hematology and Oncology | Admitting: Hematology and Oncology

## 2018-03-05 DIAGNOSIS — Z23 Encounter for immunization: Secondary | ICD-10-CM | POA: Diagnosis not present

## 2018-03-05 DIAGNOSIS — Z9012 Acquired absence of left breast and nipple: Secondary | ICD-10-CM

## 2018-03-05 DIAGNOSIS — C50412 Malignant neoplasm of upper-outer quadrant of left female breast: Secondary | ICD-10-CM | POA: Insufficient documentation

## 2018-03-05 DIAGNOSIS — Z170 Estrogen receptor positive status [ER+]: Secondary | ICD-10-CM | POA: Diagnosis not present

## 2018-03-05 MED ORDER — INFLUENZA VAC SPLIT HIGH-DOSE 0.5 ML IM SUSY
0.5000 mL | PREFILLED_SYRINGE | Freq: Once | INTRAMUSCULAR | Status: DC
  Filled 2018-03-05: qty 0.5

## 2018-03-05 MED ORDER — ANASTROZOLE 1 MG PO TABS: 1.0000 mg | ORAL_TABLET | Freq: Every day | ORAL | 3 refills | Status: DC

## 2018-03-05 MED ORDER — CO Q-10 200 MG PO CAPS: 1.0000 | ORAL_CAPSULE | Freq: Every day | ORAL | 0 refills | Status: AC

## 2018-03-05 MED ORDER — INFLUENZA VAC SPLIT HIGH-DOSE 0.5 ML IM SUSY
0.5000 mL | PREFILLED_SYRINGE | Freq: Once | INTRAMUSCULAR | Status: AC
  Administered 2018-03-05: 0.5 mL via INTRAMUSCULAR
  Filled 2018-03-05: qty 0.5

## 2018-03-05 NOTE — Patient Instructions (Signed)
Preventing Influenza, Adult Influenza, more commonly known as "the flu," is a viral infection that mainly affects the respiratory tract. The respiratory tract includes structures that help you breathe, such as the lungs, nose, and throat. The flu causes many common cold symptoms, as well as a high fever and body aches. The flu spreads easily from person to person (is contagious). The flu is most common from December through March. This is called flu season.You can catch the flu virus by:  Breathing in droplets from an infected person's cough or sneeze.  Touching something that was recently contaminated with the virus and then touching your mouth, nose, or eyes.  What can I do to lower my risk? You can decrease your risk of getting the flu by:  Getting a flu shot (influenza vaccination) every year. This is the best way to prevent the flu. A flu shot is recommended for everyone age 6 months and older. ? It is best to get a flu shot in the fall, as soon as it is available. Getting a flu shot during winter or spring instead is still a good idea. Flu season can last into early spring. ? Preventing the flu through vaccination requires getting a new flu shot every year. This is because the flu virus changes slightly (mutates) from one year to the next. Even if a flu shot does not completely protect you from all flu virus mutations, it can reduce the severity of your illness and prevent dangerous complications of the flu. ? If you are pregnant, you can and should get a flu shot. ? If you have had a reaction to the shot in the past or if you are allergic to eggs, check with your health care provider before getting a flu shot. ? Sometimes the vaccine is available as a nasal spray. In some years, the nasal spray has not been as effective against the flu virus. Check with your health care provider if you have questions about this.  Practicing good health habits. This is especially important during flu  season. ? Avoid contact with people who are sick with flu or cold symptoms. ? Wash your hands with soap and water often. If soap and water are not available, use hand sanitizer. ? Avoid touching your hands to your face, especially when you have not washed your hands recently. ? Use a disinfectant to clean surfaces at home and at work that may be contaminated with the flu virus. ? Keep your body's disease-fighting system (immune system) in good shape by eating a healthy diet, drinking plenty of fluids, getting enough sleep, and exercising regularly.  If you do get the flu, avoid spreading it to others by:  Staying home until your symptoms have been gone for at least one day.  Covering your mouth and nose with your elbow when you cough or sneeze.  Avoiding close contact with others, especially babies and elderly people.  Why are these changes important? Getting a flu shot and practicing good health habits protects you as well as other people. If you get the flu, your friends, family, and co-workers are also at risk of getting it, because it spreads so easily to others. Each year, about 2 out of every 10 people get the flu. Having the flu can lead to complications, such as pneumonia, ear infection, and sinus infection. The flu also can be deadly, especially for babies, people older than age 65, and people who have serious long-term diseases. How is this treated? Most people recover   from the flu by resting at home and drinking plenty of fluids. However, a prescription antiviral medicine may reduce your flu symptoms and may make your flu go away sooner. This medicine must be started within a few days of getting flu symptoms. You can talk with your health care provider about whether you need an antiviral medicine. Antiviral medicine may be prescribed for people who are at risk for more serious flu symptoms. This includes people who:  Are older than age 21.  Are pregnant.  Have a condition that  makes the flu worse or more dangerous.  Where to find more information:  Centers for Disease Control and Prevention: http://www.[employer]-bell.org/  [employer].ee: azureicus.com  American Academy of Family Physicians: familydoctor.org/familydoctor/en/kids/vaccines/preventing-the-flu.html Contact a health care provider if:  You have influenza and you develop new symptoms.  You have: ? Chest pain. ? Diarrhea. ? A fever.  Your cough gets worse, or you produce more mucus. Summary  The best way to prevent the flu is to get a flu shot every year in the fall.  Even if you get the flu after you have received the yearly vaccine, your flu may be milder and go away sooner because of your flu shot.  If you get the flu, antiviral medicines that are started with a few days of symptoms may reduce your flu symptoms and may make your flu go away sooner.  You can also help prevent the flu by practicing good health habits. This information is not intended to replace advice given to you by your health care provider. Make sure you discuss any questions you have with your health care provider. Document Released: 05/03/2015 Document Revised: 12/26/2015 Document Reviewed: 12/26/2015 Elsevier Interactive Patient Education  2018 [employer].   Influenza Virus Vaccine injection What is this medicine? INFLUENZA VIRUS VACCINE (in floo EN zuh VAHY ruhs vak SEEN) helps to reduce the risk of getting influenza also known as the flu. The vaccine only helps protect you against some strains of the flu. This medicine may be used for other purposes; ask your health care provider or pharmacist if you have questions. COMMON BRAND NAME(S): Afluria, Agriflu, Alfuria, FLUAD, Fluarix, Fluarix Quadrivalent, Flublok, Flublok Quadrivalent, FLUCELVAX, Flulaval, Fluvirin, Fluzone, Fluzone High-Dose, Fluzone Intradermal What should I tell my health care provider before I take this medicine? They need to know if you  have any of these conditions: -bleeding disorder like hemophilia -fever or infection -Guillain-Barre syndrome or other neurological problems -immune system problems -infection with the human immunodeficiency virus (HIV) or AIDS -low blood platelet counts -multiple sclerosis -an unusual or allergic reaction to influenza virus vaccine, latex, other medicines, foods, dyes, or preservatives. Different brands of vaccines contain different allergens. Some may contain latex or eggs. Talk to your doctor about your allergies to make sure that you get the right vaccine. -pregnant or trying to get pregnant -breast-feeding How should I use this medicine? This vaccine is for injection into a muscle or under the skin. It is given by a health care professional. A copy of Vaccine Information Statements will be given before each vaccination. Read this sheet carefully each time. The sheet may change frequently. Talk to your healthcare provider to see which vaccines are right for you. Some vaccines should not be used in all age groups. Overdosage: If you think you have taken too much of this medicine contact a poison control center or emergency room at once. NOTE: This medicine is only for you. Do not share this medicine with others. What  if I miss a dose? This does not apply. What may interact with this medicine? -chemotherapy or radiation therapy -medicines that lower your immune system like etanercept, anakinra, infliximab, and adalimumab -medicines that treat or prevent blood clots like warfarin -phenytoin -steroid medicines like prednisone or cortisone -theophylline -vaccines This list may not describe all possible interactions. Give your health care provider a list of all the medicines, herbs, non-prescription drugs, or dietary supplements you use. Also tell them if you smoke, drink alcohol, or use illegal drugs. Some items may interact with your medicine. What should I watch for while using this  medicine? Report any side effects that do not go away within 3 days to your doctor or health care professional. Call your health care provider if any unusual symptoms occur within 6 weeks of receiving this vaccine. You may still catch the flu, but the illness is not usually as bad. You cannot get the flu from the vaccine. The vaccine will not protect against colds or other illnesses that may cause fever. The vaccine is needed every year. What side effects may I notice from receiving this medicine? Side effects that you should report to your doctor or health care professional as soon as possible: -allergic reactions like skin rash, itching or hives, swelling of the face, lips, or tongue Side effects that usually do not require medical attention (report to your doctor or health care professional if they continue or are bothersome): -fever -headache -muscle aches and pains -pain, tenderness, redness, or swelling at the injection site -tiredness This list may not describe all possible side effects. Call your doctor for medical advice about side effects. You may report side effects to FDA at 1-800-FDA-1088. Where should I keep my medicine? The vaccine will be given by a health care professional in a clinic, pharmacy, doctor's office, or other health care setting. You will not be given vaccine doses to store at home. NOTE: This sheet is a summary. It may not cover all possible information. If you have questions about this medicine, talk to your doctor, pharmacist, or health care provider.  2018 Elsevier/Gold Standard (2014-11-07 10:07:28)

## 2018-03-05 NOTE — Telephone Encounter (Signed)
Printed avs and calendar °

## 2018-03-05 NOTE — Progress Notes (Signed)
 Patient Care Team: Gates, Donna, MD as PCP - General (Family Medicine) Edwards, James, MD (Gastroenterology) Gould, Karen, MD (Dermatology) Causey, Lindsey Cornetto, NP as Nurse Practitioner (Hematology and Oncology) Ingram, Haywood, MD as Consulting Physician (General Surgery) Gudena, Vinay, MD as Consulting Physician (Hematology and Oncology)  DIAGNOSIS:  Encounter Diagnosis  Name Primary?  . Malignant neoplasm of upper-outer quadrant of left breast in female, estrogen receptor positive (HCC)     SUMMARY OF ONCOLOGIC HISTORY:   Breast cancer of upper-outer quadrant of left female breast (HCC)   11/10/2015 Initial Diagnosis    Left breast biopsy 2:00, 1.5 x 0.7 x 0.7 cm : Grade 1, IDC with ADH ER 100%, PR 0%, Ki-67 10%, HER-2 neg ratio 1.28; 12:00: 2.8 cm Inv mamm ca grade 1-2,  ER 90%, PR 30%, Ki-67 10%, HER-2 neg ratio 1.15; total size 4.8 cm, T2 N0 stage II a clinical stage    11/26/2015 Genetic Testing    Genetic testing was normal, and did not reveal a deleterious mutation in these genes.  Additionally, no variants of uncertain significance (VUSes) were found. Genes tested include: ATM, BARD1, BRCA1, BRCA2, BRIP1, CDH1, CHEK2, FANCC, MLH1, MSH2, MSH6, NBN, PALB2, PMS2, PTEN, RAD51C, RAD51D, TP53, and XRCC2.  This panel also includes deletion/duplication analysis (without sequencing) for one gene, EPCAM.     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 cm, low-grade DCIS, LCIS, ER 100% and 90%; PR 0% and 30%, HER-2 negative, Ki-67 10%, T2 NX stage II a     12/29/2015 Oncotype testing    Oncotype score 26, 17% ROR (intermediate)    01/2016 -  Anti-estrogen oral therapy    Anastrozole 1mg daily    02/22/2016 Imaging    CT abdomen pelvis to evaluate pancreatic lesion: Less conspicuous uncinate process of pancreas lesion measuring 8 x 4 mm previously (11 x 8 mm)     CHIEF COMPLIANT: Follow-up on anastrozole therapy  INTERVAL  HISTORY: Sherry Rice is a 72-year-old with above-mentioned history of left breast cancer treated with mastectomy and is now on antiestrogen therapy with anastrozole.  She is tolerating anastrozole extremely well.  Does not have any hot flashes or myalgias.  REVIEW OF SYSTEMS:   Constitutional: Denies fevers, chills or abnormal weight loss Eyes: Denies blurriness of vision Ears, nose, mouth, throat, and face: Denies mucositis or sore throat Respiratory: Denies cough, dyspnea or wheezes Cardiovascular: Denies palpitation, chest discomfort Gastrointestinal:  Denies nausea, heartburn or change in bowel habits Skin: Denies abnormal skin rashes Lymphatics: Denies new lymphadenopathy or easy bruising Neurological:Denies numbness, tingling or new weaknesses Behavioral/Psych: Mood is stable, no new changes  Extremities: No lower extremity edema Breast:  denies any pain or lumps or nodules in either breasts All other systems were reviewed with the patient and are negative.  I have reviewed the past medical history, past surgical history, social history and family history with the patient and they are unchanged from previous note.  ALLERGIES:  is allergic to codeine.  MEDICATIONS:  Current Outpatient Medications  Medication Sig Dispense Refill  . anastrozole (ARIMIDEX) 1 MG tablet Take 1 tablet (1 mg total) by mouth daily. 90 tablet 3  . cholecalciferol (VITAMIN D) 1000 units tablet Take 1,000 Units by mouth daily.    . Coenzyme Q10 (CO Q-10) 200 MG CAPS Take 1 tablet by mouth daily.  0  . losartan-hydrochlorothiazide (HYZAAR) 100-25 MG per tablet TAKE 1 TABLET BY MOUTH EVERY DAY 90 tablet 1  .   pyridOXINE (VITAMIN B-6) 50 MG tablet Take 1 tablet (50 mg total) daily by mouth.     No current facility-administered medications for this visit.    Facility-Administered Medications Ordered in Other Visits  Medication Dose Route Frequency Provider Last Rate Last Dose  . Influenza vac split  quadrivalent PF (FLUZONE HIGH-DOSE) injection 0.5 mL  0.5 mL Intramuscular Once Nicholas Lose, MD        PHYSICAL EXAMINATION: ECOG PERFORMANCE STATUS: 1 - Symptomatic but completely ambulatory  Vitals:   03/05/18 1442  BP: 140/84  Pulse: 64  Resp: 17  Temp: 98.6 F (37 C)  SpO2: 100%   Filed Weights   03/05/18 1442  Weight: 124 lb 8 oz (56.5 kg)    GENERAL:alert, no distress and comfortable SKIN: skin color, texture, turgor are normal, no rashes or significant lesions EYES: normal, Conjunctiva are pink and non-injected, sclera clear OROPHARYNX:no exudate, no erythema and lips, buccal mucosa, and tongue normal  NECK: supple, thyroid normal size, non-tender, without nodularity LYMPH:  no palpable lymphadenopathy in the cervical, axillary or inguinal LUNGS: clear to auscultation and percussion with normal breathing effort HEART: regular rate & rhythm and no murmurs and no lower extremity edema ABDOMEN:abdomen soft, non-tender and normal bowel sounds MUSCULOSKELETAL:no cyanosis of digits and no clubbing  NEURO: alert & oriented x 3 with fluent speech, no focal motor/sensory deficits EXTREMITIES: No lower extremity edema   LABORATORY DATA:  I have reviewed the data as listed CMP Latest Ref Rng & Units 02/26/2016 11/25/2015 04/16/2013  Glucose 70 - 140 mg/dl 91 117 82  BUN 7.0 - 26.0 mg/dL 12.9 15.5 15  Creatinine 0.6 - 1.1 mg/dL 0.7 0.8 0.5  Sodium 136 - 145 mEq/L 141 142 138  Potassium 3.5 - 5.1 mEq/L 4.9 3.8 4.0  Chloride 96 - 112 mEq/L - - 103  CO2 22 - 29 mEq/L _0 Calcium 8.4 - 10.4 mg/dL 9.7 9.2 9.5  Total Protein 6.4 - 8.3 g/dL 7.5 6.8 -  Total Bilirubin 0.20 - 1.20 mg/dL 0.55 0.59 -  Alkaline Phos 40 - 150 U/L 85 82 -  AST 5 - 34 U/L 27 24 -  ALT 0 - 55 U/L 23 23 -    Lab Results  Component Value Date   WBC 10.0 02/26/2016   HGB 15.1 02/26/2016   HCT 45.0 02/26/2016   MCV 103.6 (H) 02/26/2016   PLT 216 02/26/2016   NEUTROABS 5.4 02/26/2016     ASSESSMENT & PLAN:  Breast cancer of upper-outer quadrant of left female breast ([place]) 11/10/2015: Left breast biopsy 2:00, 1.5 x 0.7 x 0.7 cm : Grade 1, IDC with ADH ER 100%, PR 0%, Ki-67 10%, HER-2 neg ratio 1.28; 12:00: 2.8 cm Inv mamm ca grade 1-2, ER 90%, PR 30%, Ki-67 10%, HER-2 neg ratio 1.15; total size 4.8 cm, T2 N0 stage II a clinical stage  12/18/2015: Simple mastectomy: Invasive tubulolobular carcinoma, grade 1, 3.6 cm, low-grade DCIS, LCIS, ER 100% and 90%; PR 0% and 30%, HER-2 negative, Ki-67 10%, T2 NX stage II a   Oncotype DX score 26, 17% risk of recurrence with tamoxifen alone (intermediate risk) CT abdomen pelvis 02/26/2016: No evidence of metastatic disease  Treatment plan: 1.Adjuvant systemic chemotherapy with Taxotere and Cytoxan every 3 weeks 4(patient has not finalized her decision regarding chemotherapy) 2.followed by adjuvant antiestrogen therapy with anastrozole 5-10 years(started September 2017) --------------------------------------------------------------------------------------------------------------------------------------------------------------- Anastrozole toxicities: Denies any hot flashes or myalgias. She is tolerating extremely well.  Surveillance: 1.  Breast exam 03/05/2018: Benign 2. Mammogram and ultrasound 02/23/2018: No evidence of malignancy  .  Renewed her prescription for anastrozole. Return to clinic in 1 year for follow-up    Orders Placed This Encounter  Procedures  . Flu vaccine HIGH DOSE PF (Fluzone High Dose)   The patient has a good understanding of the overall plan. she agrees with it. she will call with any problems that may develop before the next visit here.   Viinay K Gudena, MD 03/05/18    

## 2018-03-05 NOTE — Assessment & Plan Note (Signed)
11/10/2015: Left breast biopsy 2:00, 1.5 x 0.7 x 0.7 cm : Grade 1, IDC with ADH ER 100%, PR 0%, Ki-67 10%, HER-2 neg ratio 1.28; 12:00: 2.8 cm Inv mamm ca grade 1-2, ER 90%, PR 30%, Ki-67 10%, HER-2 neg ratio 1.15; total size 4.8 cm, T2 N0 stage II a clinical stage  12/18/2015: Simple mastectomy: Invasive tubulolobular carcinoma, grade 1, 3.6 cm, low-grade DCIS, LCIS, ER 100% and 90%; PR 0% and 30%, HER-2 negative, Ki-67 10%, T2 NX stage II a   Oncotype DX score 26, 17% risk of recurrence with tamoxifen alone (intermediate risk) CT abdomen pelvis 02/26/2016: No evidence of metastatic disease  Treatment plan: 1.Adjuvant systemic chemotherapy with Taxotere and Cytoxan every 3 weeks 4(patient has not finalized her decision regarding chemotherapy) 2.followed by adjuvant antiestrogen therapy with anastrozole 5-10 years(started September 2017) --------------------------------------------------------------------------------------------------------------------------------------------------------------- Anastrozole toxicities: Denies any hot flashes or myalgias. She is tolerating extremely well.  Surveillance: 1.  Breast exam 03/05/2018: Benign 2. Mammogram and ultrasound 02/23/2018: No evidence of malignancy  .  Renewed her prescription for anastrozole. Return to clinic in 1 year for follow-up

## 2018-03-06 ENCOUNTER — Encounter: Payer: Self-pay | Admitting: Hematology and Oncology

## 2018-05-03 ENCOUNTER — Other Ambulatory Visit: Payer: Self-pay | Admitting: Hematology and Oncology

## 2019-01-31 ENCOUNTER — Other Ambulatory Visit: Payer: Self-pay | Admitting: Hematology and Oncology

## 2019-01-31 DIAGNOSIS — Z1231 Encounter for screening mammogram for malignant neoplasm of breast: Secondary | ICD-10-CM

## 2019-02-25 ENCOUNTER — Telehealth: Payer: Self-pay | Admitting: Oncology

## 2019-02-25 NOTE — Telephone Encounter (Signed)
Call day 11/4. Appointment moved from 11/4 to 11/19. Confirmed with patient.

## 2019-03-06 ENCOUNTER — Ambulatory Visit: Payer: Medicare Other | Admitting: Hematology and Oncology

## 2019-03-14 ENCOUNTER — Encounter: Payer: Self-pay | Admitting: Hematology and Oncology

## 2019-03-18 ENCOUNTER — Other Ambulatory Visit: Payer: Self-pay

## 2019-03-18 ENCOUNTER — Ambulatory Visit
Admission: RE | Admit: 2019-03-18 | Discharge: 2019-03-18 | Disposition: A | Discharge status: Home / Self Care | Payer: Medicare Other | Source: Ambulatory Visit | Attending: Hematology and Oncology | Admitting: Hematology and Oncology

## 2019-03-18 DIAGNOSIS — Z1231 Encounter for screening mammogram for malignant neoplasm of breast: Secondary | ICD-10-CM

## 2019-03-20 NOTE — Progress Notes (Signed)
Patient Care Team: Sherry Small, MD as PCP - General (Family Medicine) Sherry Spates, MD (Gastroenterology) Sherry Pigg, MD (Dermatology) Sherry Rice, Sherry Massed, NP as Nurse Practitioner (Hematology and Oncology) Sherry Skates, MD as Consulting Physician (General Surgery) Sherry Lose, MD as Consulting Physician (Hematology and Oncology)  DIAGNOSIS:    ICD-10-CM   1. Malignant neoplasm of upper-outer quadrant of left breast in female, estrogen receptor positive ([place])  C50.412    Z17.0     SUMMARY OF ONCOLOGIC HISTORY: Oncology History  Breast cancer of upper-outer quadrant of left female breast ([place])  11/10/2015 Initial Diagnosis   Left breast biopsy 2:00, 1.5 x 0.7 x 0.7 cm : Grade 1, IDC with ADH ER 100%, PR 0%, Ki-67 10%, HER-2 neg ratio 1.28; 12:00: 2.8 cm Inv mamm ca grade 1-2,  ER 90%, PR 30%, Ki-67 10%, HER-2 neg ratio 1.15; total size 4.8 cm, T2 N0 stage II a clinical stage   11/26/2015 Genetic Testing   Genetic testing was normal, and did not reveal a deleterious mutation in these genes.  Additionally, no variants of uncertain significance (VUSes) were found. Genes tested include: ATM, BARD1, BRCA1, BRCA2, BRIP1, CDH1, CHEK2, FANCC, MLH1, MSH2, MSH6, NBN, PALB2, PMS2, PTEN, RAD51C, RAD51D, TP53, and XRCC2.  This panel also includes deletion/duplication analysis (without sequencing) for one gene, EPCAM.    12/18/2015 Surgery   Simple mastectomy Sherry Rice): Invasive tubulolobular carcinoma, grade 1, 3.6 cm, low-grade DCIS, LCIS, ER 100% and 90%; PR 0% and 30%, HER-2 negative, Ki-67 10%, T2 NX stage II a    12/29/2015 Oncotype testing   Oncotype score 26, 17% ROR (intermediate)   01/2016 -  Anti-estrogen oral therapy   Anastrozole 64m daily   02/22/2016 Imaging   CT abdomen pelvis to evaluate pancreatic lesion: Less conspicuous uncinate process of pancreas lesion measuring 8 x 4 mm previously (11 x 8 mm)     CHIEF COMPLIANT: Follow-up on anastrozole therapy  INTERVAL  HISTORY: Sherry Rice a 73y.o. with above-mentioned history of left breast cancer treated with mastectomy and is currently on antiestrogen therapy with anastrozole. I last saw her a year ago. Mammogram on 03/18/19 showed no evidence of malignancy in the right breast. She presents to the clinic today for follow-up.   REVIEW OF SYSTEMS:   Constitutional: Denies fevers, chills or abnormal weight loss Eyes: Denies blurriness of vision Ears, nose, mouth, throat, and face: Denies mucositis or sore throat Respiratory: Denies cough, dyspnea or wheezes Cardiovascular: Denies palpitation, chest discomfort Gastrointestinal: Denies nausea, heartburn or change in bowel habits Skin: Denies abnormal skin rashes Lymphatics: Denies new lymphadenopathy or easy bruising Neurological: Denies numbness, tingling or new weaknesses Behavioral/Psych: Mood is stable, no new changes  Extremities: No lower extremity edema Breast: denies any pain or lumps or nodules in either breasts All other systems were reviewed with the patient and are negative.  I have reviewed the past medical history, past surgical history, social history and family history with the patient and they are unchanged from previous note.  ALLERGIES:  is allergic to codeine.  MEDICATIONS:  Current Outpatient Medications  Medication Sig Dispense Refill  . anastrozole (ARIMIDEX) 1 MG tablet TAKE 1 TABLET(1 MG) BY MOUTH DAILY 90 tablet 0  . cholecalciferol (VITAMIN D) 1000 units tablet Take 1,000 Units by mouth daily.    . Coenzyme Q10 (CO Q-10) 200 MG CAPS Take 1 tablet by mouth daily.  0  . losartan-hydrochlorothiazide (HYZAAR) 100-25 MG per tablet TAKE 1 TABLET BY MOUTH EVERY  DAY 90 tablet 1  . pyridOXINE (VITAMIN B-6) 50 MG tablet Take 1 tablet (50 mg total) daily by mouth.     No current facility-administered medications for this visit.    Facility-Administered Medications Ordered in Other Visits  Medication Dose Route Frequency  Provider Last Rate Last Dose  . Influenza vac split quadrivalent PF (FLUZONE HIGH-DOSE) injection 0.5 mL  0.5 mL Intramuscular Once Sherry Lose, MD        PHYSICAL EXAMINATION: ECOG PERFORMANCE STATUS: 1 - Symptomatic but completely ambulatory  There were no vitals filed for this visit. There were no vitals filed for this visit.  GENERAL: alert, no distress and comfortable SKIN: skin color, texture, turgor are normal, no rashes or significant lesions EYES: normal, Conjunctiva are pink and non-injected, sclera clear OROPHARYNX: no exudate, no erythema and lips, buccal mucosa, and tongue normal  NECK: supple, thyroid normal size, non-tender, without nodularity LYMPH: no palpable lymphadenopathy in the cervical, axillary or inguinal LUNGS: clear to auscultation and percussion with normal breathing effort HEART: regular rate & rhythm and no murmurs and no lower extremity edema ABDOMEN: abdomen soft, non-tender and normal bowel sounds MUSCULOSKELETAL: no cyanosis of digits and no clubbing  NEURO: alert & oriented x 3 with fluent speech, no focal motor/sensory deficits EXTREMITIES: No lower extremity edema BREAST: No palpable masses or nodules in either right or left breasts. No palpable axillary supraclavicular or infraclavicular adenopathy no breast tenderness or nipple discharge. (exam performed in the presence of a chaperone)  LABORATORY DATA:  I have reviewed the data as listed CMP Latest Ref Rng & Units 02/26/2016 11/25/2015 04/16/2013  Glucose 70 - 140 mg/dl 91 117 82  BUN 7.0 - 26.0 mg/dL 12.9 15.5 15  Creatinine 0.6 - 1.1 mg/dL 0.7 0.8 0.5  Sodium 136 - 145 mEq/L 141 142 138  Potassium 3.5 - 5.1 mEq/L 4.9 3.8 4.0  Chloride 96 - 112 mEq/L - - 103  CO2 22 - 29 mEq/L _0 Calcium 8.4 - 10.4 mg/dL 9.7 9.2 9.5  Total Protein 6.4 - 8.3 g/dL 7.5 6.8 -  Total Bilirubin 0.20 - 1.20 mg/dL 0.55 0.59 -  Alkaline Phos 40 - 150 U/L 85 82 -  AST 5 - 34 U/L 27 24 -  ALT 0 - 55 U/L 23  23 -    Lab Results  Component Value Date   WBC 10.0 02/26/2016   HGB 15.1 02/26/2016   HCT 45.0 02/26/2016   MCV 103.6 (H) 02/26/2016   PLT 216 02/26/2016   NEUTROABS 5.4 02/26/2016    ASSESSMENT & PLAN:  Breast cancer of upper-outer quadrant of left female breast ([place]) 11/10/2015: Left breast biopsy 2:00, 1.5 x 0.7 x 0.7 cm : Grade 1, IDC with ADH ER 100%, PR 0%, Ki-67 10%, HER-2 neg ratio 1.28; 12:00: 2.8 cm Inv mamm ca grade 1-2, ER 90%, PR 30%, Ki-67 10%, HER-2 neg ratio 1.15; total size 4.8 cm, T2 N0 stage II a clinical stage  12/18/2015: Simple mastectomy: Invasive tubulolobular carcinoma, grade 1, 3.6 cm, low-grade DCIS, LCIS, ER 100% and 90%; PR 0% and 30%, HER-2 negative, Ki-67 10%, T2 NX stage II a   Oncotype DX score 26, 17% risk of recurrence with tamoxifen alone (intermediate risk) CT abdomen pelvis 02/26/2016: No evidence of metastatic disease  Treatment plan: 1.Adjuvant systemic chemotherapy with Taxotere and Cytoxan every 3 weeks 4(patient has not finalized her decision regarding chemotherapy) 2.followed by adjuvant antiestrogen therapy with anastrozole 5-10 years(started September 2017) --------------------------------------------------------------------------------------------------------------------------------------------------------------- Anastrozole  toxicities: Denies any hot flashes or myalgias. She is tolerating extremely well.  Surveillance: 1.Breast exam 03/21/19: Benign 2.Right Mammogram and ultrasound 03/18/19: No evidence of malignancy .  Renewed her prescription for anastrozole. Flu shot to be given today. Ankle pain: We will get an x-ray of the right ankle today and I will call her tomorrow with the results.  Return to clinic in 1 year for follow-up    No orders of the defined types were placed in this encounter.  The patient has a good understanding of the overall plan. she agrees with it. she will call with any problems that  may develop before the next visit here.  Sherry Lose, MD 03/21/2019  Julious Oka Dorshimer, am acting as scribe for Dr. Nicholas Rice.  I have reviewed the above documentation for accuracy and completeness, and I agree with the above.

## 2019-03-21 ENCOUNTER — Other Ambulatory Visit: Payer: Self-pay | Admitting: Pharmacist

## 2019-03-21 ENCOUNTER — Inpatient Hospital Stay: Payer: Medicare Other

## 2019-03-21 ENCOUNTER — Other Ambulatory Visit: Payer: Self-pay | Admitting: *Deleted

## 2019-03-21 ENCOUNTER — Other Ambulatory Visit: Payer: Self-pay

## 2019-03-21 ENCOUNTER — Inpatient Hospital Stay: Payer: Medicare Other | Attending: Hematology and Oncology | Admitting: Hematology and Oncology

## 2019-03-21 ENCOUNTER — Ambulatory Visit (HOSPITAL_COMMUNITY)
Admission: RE | Admit: 2019-03-21 | Discharge: 2019-03-21 | Disposition: A | Discharge status: Home / Self Care | Payer: Medicare Other | Source: Ambulatory Visit | Attending: Hematology and Oncology | Admitting: Hematology and Oncology

## 2019-03-21 ENCOUNTER — Ambulatory Visit: Payer: Medicare Other

## 2019-03-21 VITALS — BP 125/86 | HR 70 | Temp 98.5°F | Resp 17 | Ht 64.0 in | Wt 131.1 lb

## 2019-03-21 DIAGNOSIS — Z23 Encounter for immunization: Secondary | ICD-10-CM | POA: Insufficient documentation

## 2019-03-21 DIAGNOSIS — Z170 Estrogen receptor positive status [ER+]: Secondary | ICD-10-CM | POA: Diagnosis not present

## 2019-03-21 DIAGNOSIS — Z9012 Acquired absence of left breast and nipple: Secondary | ICD-10-CM | POA: Diagnosis not present

## 2019-03-21 DIAGNOSIS — M25571 Pain in right ankle and joints of right foot: Secondary | ICD-10-CM

## 2019-03-21 DIAGNOSIS — C50412 Malignant neoplasm of upper-outer quadrant of left female breast: Secondary | ICD-10-CM | POA: Diagnosis not present

## 2019-03-21 MED ORDER — INFLUENZA VAC A&B SA ADJ QUAD 0.5 ML IM PRSY
0.5000 mL | PREFILLED_SYRINGE | Freq: Once | INTRAMUSCULAR | Status: DC
  Filled 2019-03-21: qty 0.5

## 2019-03-21 MED ORDER — TURMERIC 1053 MG PO TABS: 1000.0000 mg | ORAL_TABLET | Freq: Every day | ORAL | Status: AC

## 2019-03-21 MED ORDER — INFLUENZA VAC A&B SA ADJ QUAD 0.5 ML IM PRSY
PREFILLED_SYRINGE | INTRAMUSCULAR | Status: AC
  Filled 2019-03-21: qty 0.5

## 2019-03-21 MED ORDER — INFLUENZA VAC A&B SA ADJ QUAD 0.5 ML IM PRSY
0.5000 mL | PREFILLED_SYRINGE | INTRAMUSCULAR | Status: DC
  Filled 2019-03-21: qty 0.5

## 2019-03-21 MED ORDER — ANASTROZOLE 1 MG PO TABS: 1.0000 mg | ORAL_TABLET | Freq: Every day | ORAL | 3 refills | Status: DC

## 2019-03-21 MED ORDER — INFLUENZA VAC SPLIT QUAD 0.5 ML IM SUSY: 0.5000 mL | PREFILLED_SYRINGE | Freq: Once | INTRAMUSCULAR | Status: DC

## 2019-03-21 MED ORDER — INFLUENZA VAC SPLIT QUAD 0.5 ML IM SUSY
PREFILLED_SYRINGE | INTRAMUSCULAR | Status: AC
Start: 2019-03-21 — End: ?
  Filled 2019-03-21: qty 0.5

## 2019-03-21 MED ORDER — INFLUENZA VAC A&B SA ADJ QUAD 0.5 ML IM PRSY: 0.5000 mL | PREFILLED_SYRINGE | Freq: Once | INTRAMUSCULAR | Status: AC

## 2019-03-21 NOTE — Addendum Note (Signed)
Addended by: Myrtie Hawk on: 03/21/2019 04:02 PM   Modules accepted: Orders

## 2019-03-21 NOTE — Progress Notes (Unsigned)
error 

## 2019-03-21 NOTE — Addendum Note (Signed)
Addended by: Myrtie Hawk on: 03/21/2019 03:59 PM   Modules accepted: Orders

## 2019-03-21 NOTE — Assessment & Plan Note (Signed)
11/10/2015: Left breast biopsy 2:00, 1.5 x 0.7 x 0.7 cm : Grade 1, IDC with ADH ER 100%, PR 0%, Ki-67 10%, HER-2 neg ratio 1.28; 12:00: 2.8 cm Inv mamm ca grade 1-2, ER 90%, PR 30%, Ki-67 10%, HER-2 neg ratio 1.15; total size 4.8 cm, T2 N0 stage II a clinical stage  12/18/2015: Simple mastectomy: Invasive tubulolobular carcinoma, grade 1, 3.6 cm, low-grade DCIS, LCIS, ER 100% and 90%; PR 0% and 30%, HER-2 negative, Ki-67 10%, T2 NX stage II a   Oncotype DX score 26, 17% risk of recurrence with tamoxifen alone (intermediate risk) CT abdomen pelvis 02/26/2016: No evidence of metastatic disease  Treatment plan: 1.Adjuvant systemic chemotherapy with Taxotere and Cytoxan every 3 weeks 4(patient has not finalized her decision regarding chemotherapy) 2.followed by adjuvant antiestrogen therapy with anastrozole 5-10 years(started September 2017) --------------------------------------------------------------------------------------------------------------------------------------------------------------- Anastrozole toxicities: Denies any hot flashes or myalgias. She is tolerating extremely well.  Surveillance: 1.Breast exam 03/21/19: Benign 2.Right Mammogram and ultrasound 03/18/19: No evidence of malignancy .  Renewed her prescription for anastrozole. Return to clinic in 1 year for follow-up

## 2019-03-21 NOTE — Progress Notes (Signed)
error 

## 2019-03-21 NOTE — Addendum Note (Signed)
Addended by: Myrtie Hawk on: 03/21/2019 04:24 PM   Modules accepted: Orders

## 2019-03-21 NOTE — Progress Notes (Signed)
Unable to give scan high dose flu shot for pt due to errors in epic and orders being read only.  After several attempts on entering the injection by RN and by pharmacy.  Injection given to pt, two nurse (Val, RN and myself) verification with right patient, right drug, right dose, and right route.  Injection given into the right arm, pt answered no to all allergy questions and was given information regarding the flu vaccination.

## 2019-03-22 ENCOUNTER — Telehealth: Payer: Self-pay | Admitting: Hematology and Oncology

## 2019-03-22 NOTE — Telephone Encounter (Signed)
Scheduled per los. Called and left msg. Mailed printout  °

## 2019-04-30 ENCOUNTER — Other Ambulatory Visit: Payer: Self-pay | Admitting: Hematology and Oncology

## 2019-07-27 ENCOUNTER — Other Ambulatory Visit: Payer: Self-pay | Admitting: Hematology and Oncology

## 2019-10-24 ENCOUNTER — Other Ambulatory Visit: Payer: Self-pay | Admitting: Hematology and Oncology

## 2020-02-20 ENCOUNTER — Other Ambulatory Visit: Payer: Self-pay | Admitting: Hematology and Oncology

## 2020-02-20 ENCOUNTER — Encounter: Payer: Self-pay | Admitting: Hematology and Oncology

## 2020-02-20 DIAGNOSIS — Z1231 Encounter for screening mammogram for malignant neoplasm of breast: Secondary | ICD-10-CM

## 2020-02-21 ENCOUNTER — Encounter: Payer: Self-pay | Admitting: Hematology and Oncology

## 2020-03-20 ENCOUNTER — Ambulatory Visit: Payer: Medicare Other | Admitting: Hematology and Oncology

## 2020-03-24 ENCOUNTER — Ambulatory Visit: Payer: Medicare Other

## 2020-04-09 ENCOUNTER — Ambulatory Visit
Admission: RE | Admit: 2020-04-09 | Discharge: 2020-04-09 | Disposition: A | Discharge status: Home / Self Care | Payer: Medicare Other | Source: Ambulatory Visit | Attending: Hematology and Oncology | Admitting: Hematology and Oncology

## 2020-04-09 ENCOUNTER — Other Ambulatory Visit: Payer: Self-pay

## 2020-04-09 DIAGNOSIS — Z1231 Encounter for screening mammogram for malignant neoplasm of breast: Secondary | ICD-10-CM

## 2020-04-13 NOTE — Progress Notes (Signed)
Patient Care Team: Maurice Small, MD as PCP - General (Family Medicine) Laurence Spates, MD (Inactive) (Gastroenterology) Jari Pigg, MD (Dermatology) Delice Bison, Charlestine Massed, NP as Nurse Practitioner (Hematology and Oncology) Fanny Skates, MD as Consulting Physician (General Surgery) Nicholas Lose, MD as Consulting Physician (Hematology and Oncology)  DIAGNOSIS:    ICD-10-CM   1. Malignant neoplasm of upper-outer quadrant of left breast in female, estrogen receptor positive ([place])  C50.412    Z17.0     SUMMARY OF ONCOLOGIC HISTORY: Oncology History  Breast cancer of upper-outer quadrant of left female breast ([place])  11/10/2015 Initial Diagnosis   Left breast biopsy 2:00, 1.5 x 0.7 x 0.7 cm : Grade 1, IDC with ADH ER 100%, PR 0%, Ki-67 10%, HER-2 neg ratio 1.28; 12:00: 2.8 cm Inv mamm ca grade 1-2,  ER 90%, PR 30%, Ki-67 10%, HER-2 neg ratio 1.15; total size 4.8 cm, T2 N0 stage II a clinical stage   11/26/2015 Genetic Testing   Genetic testing was normal, and did not reveal a deleterious mutation in these genes.  Additionally, no variants of uncertain significance (VUSes) were found. Genes tested include: ATM, BARD1, BRCA1, BRCA2, BRIP1, CDH1, CHEK2, FANCC, MLH1, MSH2, MSH6, NBN, PALB2, PMS2, PTEN, RAD51C, RAD51D, TP53, and XRCC2.  This panel also includes deletion/duplication analysis (without sequencing) for one gene, EPCAM.    12/18/2015 Surgery   Simple mastectomy Dalbert Batman): Invasive tubulolobular carcinoma, grade 1, 3.6 cm, low-grade DCIS, LCIS, ER 100% and 90%; PR 0% and 30%, HER-2 negative, Ki-67 10%, T2 NX stage II a    12/29/2015 Oncotype testing   Oncotype score 26, 17% ROR (intermediate)   01/2016 -  Anti-estrogen oral therapy   Anastrozole 62m daily   02/22/2016 Imaging   CT abdomen pelvis to evaluate pancreatic lesion: Less conspicuous uncinate process of pancreas lesion measuring 8 x 4 mm previously (11 x 8 mm)     CHIEF COMPLIANT: Follow-up of left breast cancer on  anastrozole therapy  INTERVAL HISTORY: Sherry Rice a 74y.o. with above-mentioned history of left breast cancer treated with mastectomy and is currently on antiestrogen therapy with anastrozole. Mammogram on 04/10/19 showed no evidence of malignancy in the right breast. She presents to the clinic today for follow-up.   She is tolerating anastrozole extremely well without any problems or concerns.  Denies any lumps or nodules in the breast.  ALLERGIES:  is allergic to codeine.  MEDICATIONS:  Current Outpatient Medications  Medication Sig Dispense Refill  . anastrozole (ARIMIDEX) 1 MG tablet Take 1 tablet (1 mg total) by mouth daily. 90 tablet 3  . cholecalciferol (VITAMIN D) 1000 units tablet Take 1,000 Units by mouth daily.    . Coenzyme Q10 (CO Q-10) 200 MG CAPS Take 1 tablet by mouth daily.  0  . losartan-hydrochlorothiazide (HYZAAR) 50-12.5 MG tablet Take 1 tablet by mouth daily.    .Marland KitchenpyridOXINE (VITAMIN B-6) 50 MG tablet Take 1 tablet (50 mg total) daily by mouth.    . Turmeric 1053 MG TABS Take 1,000 mg by mouth daily.     No current facility-administered medications for this visit.   Facility-Administered Medications Ordered in Other Visits  Medication Dose Route Frequency Provider Last Rate Last Admin  . influenza vaccine adjuvanted (FLUAD) injection 0.5 mL  0.5 mL Intramuscular Once GNicholas Lose MD        PHYSICAL EXAMINATION: ECOG PERFORMANCE STATUS: 1 - Symptomatic but completely ambulatory  Vitals:   04/14/20 1422  BP: 118/74  Pulse: 74  Resp: 18  Temp: 98.5 F (36.9 C)  SpO2: 99%   Filed Weights   04/14/20 1422  Weight: 129 lb 9.6 oz (58.8 kg)    BREAST: No palpable masses or nodules in either right or left breasts. No palpable axillary supraclavicular or infraclavicular adenopathy no breast tenderness or nipple discharge. (exam performed in the presence of a chaperone)  LABORATORY DATA:  I have reviewed the data as listed CMP Latest Ref Rng &  Units 02/26/2016 11/25/2015 04/16/2013  Glucose 70 - 140 mg/dl 91 117 82  BUN 7.0 - 26.0 mg/dL 12.9 15.5 15  Creatinine 0.6 - 1.1 mg/dL 0.7 0.8 0.5  Sodium 136 - 145 mEq/L 141 142 138  Potassium 3.5 - 5.1 mEq/L 4.9 3.8 4.0  Chloride 96 - 112 mEq/L - - 103  CO2 22 - 29 mEq/L _0 Calcium 8.4 - 10.4 mg/dL 9.7 9.2 9.5  Total Protein 6.4 - 8.3 g/dL 7.5 6.8 -  Total Bilirubin 0.20 - 1.20 mg/dL 0.55 0.59 -  Alkaline Phos 40 - 150 U/L 85 82 -  AST 5 - 34 U/L 27 24 -  ALT 0 - 55 U/L 23 23 -    Lab Results  Component Value Date   WBC 10.0 02/26/2016   HGB 15.1 02/26/2016   HCT 45.0 02/26/2016   MCV 103.6 (H) 02/26/2016   PLT 216 02/26/2016   NEUTROABS 5.4 02/26/2016    ASSESSMENT & PLAN:  Breast cancer of upper-outer quadrant of left female breast ([place]) 11/10/2015: Left breast biopsy 2:00, 1.5 x 0.7 x 0.7 cm : Grade 1, IDC with ADH ER 100%, PR 0%, Ki-67 10%, HER-2 neg ratio 1.28; 12:00: 2.8 cm Inv mamm ca grade 1-2, ER 90%, PR 30%, Ki-67 10%, HER-2 neg ratio 1.15; total size 4.8 cm, T2 N0 stage II a clinical stage  12/18/2015: Simple mastectomy: Invasive tubulolobular carcinoma, grade 1, 3.6 cm, low-grade DCIS, LCIS, ER 100% and 90%; PR 0% and 30%, HER-2 negative, Ki-67 10%, T2 NX stage II a   Oncotype DX score 26, 17% risk of recurrence with tamoxifen alone (intermediate risk) CT abdomen pelvis 02/26/2016: No evidence of metastatic disease  Treatment plan: 1.Adjuvant systemic chemotherapy with Taxotere and Cytoxan every 3 weeks 4(patient has not finalized her decision regarding chemotherapy) 2.followed by adjuvant antiestrogen therapy with anastrozole 5-10 years(started September 2017) --------------------------------------------------------------------------------------------------------------------------------------------------------------- Anastrozole toxicities: Denies any hot flashes or myalgias. She is tolerating extremely well.  Surveillance: 1.Breast  exam12/14/2021: Benign 2.Right Mammogram and ultrasound12/19/2021: No evidence of malignancy.  Breast density category C  Renewed her prescription for anastrozole.   Return to clinic in 1 year for follow-up    No orders of the defined types were placed in this encounter.  The patient has a good understanding of the overall plan. she agrees with it. she will call with any problems that may develop before the next visit here.  Total time spent: 20 mins including face to face time and time spent for planning, charting and coordination of care  Nicholas Lose, MD 04/14/2020  I, Cloyde Reams Dorshimer, am acting as scribe for Dr. Nicholas Lose.  I have reviewed the above documentation for accuracy and completeness, and I agree with the above.

## 2020-04-14 ENCOUNTER — Inpatient Hospital Stay: Payer: Medicare Other | Attending: Hematology and Oncology | Admitting: Hematology and Oncology

## 2020-04-14 ENCOUNTER — Other Ambulatory Visit: Payer: Self-pay

## 2020-04-14 ENCOUNTER — Telehealth: Payer: Self-pay | Admitting: Hematology and Oncology

## 2020-04-14 DIAGNOSIS — Z170 Estrogen receptor positive status [ER+]: Secondary | ICD-10-CM

## 2020-04-14 DIAGNOSIS — Z9012 Acquired absence of left breast and nipple: Secondary | ICD-10-CM | POA: Diagnosis not present

## 2020-04-14 DIAGNOSIS — C50412 Malignant neoplasm of upper-outer quadrant of left female breast: Secondary | ICD-10-CM

## 2020-04-14 MED ORDER — ANASTROZOLE 1 MG PO TABS: 1.0000 mg | ORAL_TABLET | Freq: Every day | ORAL | 3 refills | Status: DC

## 2020-04-14 MED ORDER — LOSARTAN POTASSIUM-HCTZ 50-12.5 MG PO TABS: 1.0000 | ORAL_TABLET | Freq: Every day | ORAL | Status: AC

## 2020-04-14 NOTE — Assessment & Plan Note (Signed)
11/10/2015: Left breast biopsy 2:00, 1.5 x 0.7 x 0.7 cm : Grade 1, IDC with ADH ER 100%, PR 0%, Ki-67 10%, HER-2 neg ratio 1.28; 12:00: 2.8 cm Inv mamm ca grade 1-2, ER 90%, PR 30%, Ki-67 10%, HER-2 neg ratio 1.15; total size 4.8 cm, T2 N0 stage II a clinical stage  12/18/2015: Simple mastectomy: Invasive tubulolobular carcinoma, grade 1, 3.6 cm, low-grade DCIS, LCIS, ER 100% and 90%; PR 0% and 30%, HER-2 negative, Ki-67 10%, T2 NX stage II a   Oncotype DX score 26, 17% risk of recurrence with tamoxifen alone (intermediate risk) CT abdomen pelvis 02/26/2016: No evidence of metastatic disease  Treatment plan: 1.Adjuvant systemic chemotherapy with Taxotere and Cytoxan every 3 weeks 4(patient has not finalized her decision regarding chemotherapy) 2.followed by adjuvant antiestrogen therapy with anastrozole 5-10 years(started September 2017) --------------------------------------------------------------------------------------------------------------------------------------------------------------- Anastrozole toxicities: Denies any hot flashes or myalgias. She is tolerating extremely well.  Surveillance: 1.Breast exam12/14/2021: Benign 2.Right Mammogram and ultrasound12/19/2021: No evidence of malignancy.  Breast density category C  Renewed her prescription for anastrozole.      Return to clinic in 1 year for follow-up

## 2020-04-14 NOTE — Telephone Encounter (Signed)
Scheduled appts per 12/14 los. Gave pt a print out of AVS.  

## 2020-05-07 DIAGNOSIS — L988 Other specified disorders of the skin and subcutaneous tissue: Secondary | ICD-10-CM | POA: Diagnosis not present

## 2020-05-07 DIAGNOSIS — C44319 Basal cell carcinoma of skin of other parts of face: Secondary | ICD-10-CM | POA: Diagnosis not present

## 2020-05-11 DIAGNOSIS — M84374A Stress fracture, right foot, initial encounter for fracture: Secondary | ICD-10-CM | POA: Diagnosis not present

## 2020-05-11 DIAGNOSIS — M7741 Metatarsalgia, right foot: Secondary | ICD-10-CM | POA: Diagnosis not present

## 2020-05-11 DIAGNOSIS — M79671 Pain in right foot: Secondary | ICD-10-CM | POA: Diagnosis not present

## 2020-07-20 DIAGNOSIS — H5203 Hypermetropia, bilateral: Secondary | ICD-10-CM | POA: Diagnosis not present

## 2020-07-20 DIAGNOSIS — H2513 Age-related nuclear cataract, bilateral: Secondary | ICD-10-CM | POA: Diagnosis not present

## 2020-07-20 DIAGNOSIS — H353131 Nonexudative age-related macular degeneration, bilateral, early dry stage: Secondary | ICD-10-CM | POA: Diagnosis not present

## 2020-07-20 DIAGNOSIS — H524 Presbyopia: Secondary | ICD-10-CM | POA: Diagnosis not present

## 2020-07-20 DIAGNOSIS — H52203 Unspecified astigmatism, bilateral: Secondary | ICD-10-CM | POA: Diagnosis not present

## 2020-09-07 DIAGNOSIS — M25562 Pain in left knee: Secondary | ICD-10-CM | POA: Diagnosis not present

## 2020-09-10 DIAGNOSIS — I10 Essential (primary) hypertension: Secondary | ICD-10-CM | POA: Diagnosis not present

## 2020-09-10 DIAGNOSIS — Z136 Encounter for screening for cardiovascular disorders: Secondary | ICD-10-CM | POA: Diagnosis not present

## 2020-09-10 DIAGNOSIS — E559 Vitamin D deficiency, unspecified: Secondary | ICD-10-CM | POA: Diagnosis not present

## 2020-09-14 DIAGNOSIS — C50412 Malignant neoplasm of upper-outer quadrant of left female breast: Secondary | ICD-10-CM | POA: Diagnosis not present

## 2020-09-14 DIAGNOSIS — Z0000 Encounter for general adult medical examination without abnormal findings: Secondary | ICD-10-CM | POA: Diagnosis not present

## 2020-09-14 DIAGNOSIS — I10 Essential (primary) hypertension: Secondary | ICD-10-CM | POA: Diagnosis not present

## 2020-09-14 DIAGNOSIS — E559 Vitamin D deficiency, unspecified: Secondary | ICD-10-CM | POA: Diagnosis not present

## 2020-09-14 DIAGNOSIS — M8580 Other specified disorders of bone density and structure, unspecified site: Secondary | ICD-10-CM | POA: Diagnosis not present

## 2020-09-15 DIAGNOSIS — H353131 Nonexudative age-related macular degeneration, bilateral, early dry stage: Secondary | ICD-10-CM | POA: Diagnosis not present

## 2020-09-15 DIAGNOSIS — H25813 Combined forms of age-related cataract, bilateral: Secondary | ICD-10-CM | POA: Diagnosis not present

## 2020-09-25 DIAGNOSIS — M25562 Pain in left knee: Secondary | ICD-10-CM | POA: Diagnosis not present

## 2020-09-30 DIAGNOSIS — M25562 Pain in left knee: Secondary | ICD-10-CM | POA: Diagnosis not present

## 2020-10-30 DIAGNOSIS — H25811 Combined forms of age-related cataract, right eye: Secondary | ICD-10-CM | POA: Diagnosis not present

## 2020-11-11 DIAGNOSIS — H2512 Age-related nuclear cataract, left eye: Secondary | ICD-10-CM | POA: Diagnosis not present

## 2020-11-13 DIAGNOSIS — H25812 Combined forms of age-related cataract, left eye: Secondary | ICD-10-CM | POA: Diagnosis not present

## 2020-12-06 DIAGNOSIS — S90562A Insect bite (nonvenomous), left ankle, initial encounter: Secondary | ICD-10-CM | POA: Diagnosis not present

## 2020-12-06 DIAGNOSIS — R600 Localized edema: Secondary | ICD-10-CM | POA: Diagnosis not present

## 2020-12-06 DIAGNOSIS — M79605 Pain in left leg: Secondary | ICD-10-CM | POA: Diagnosis not present

## 2020-12-06 DIAGNOSIS — W57XXXA Bitten or stung by nonvenomous insect and other nonvenomous arthropods, initial encounter: Secondary | ICD-10-CM | POA: Diagnosis not present

## 2020-12-06 DIAGNOSIS — L03116 Cellulitis of left lower limb: Secondary | ICD-10-CM | POA: Diagnosis not present

## 2021-03-03 DIAGNOSIS — L578 Other skin changes due to chronic exposure to nonionizing radiation: Secondary | ICD-10-CM | POA: Diagnosis not present

## 2021-03-03 DIAGNOSIS — D225 Melanocytic nevi of trunk: Secondary | ICD-10-CM | POA: Diagnosis not present

## 2021-03-03 DIAGNOSIS — D485 Neoplasm of uncertain behavior of skin: Secondary | ICD-10-CM | POA: Diagnosis not present

## 2021-03-03 DIAGNOSIS — L821 Other seborrheic keratosis: Secondary | ICD-10-CM | POA: Diagnosis not present

## 2021-03-03 DIAGNOSIS — L570 Actinic keratosis: Secondary | ICD-10-CM | POA: Diagnosis not present

## 2021-03-12 ENCOUNTER — Other Ambulatory Visit: Payer: Self-pay | Admitting: Hematology and Oncology

## 2021-03-12 DIAGNOSIS — Z1231 Encounter for screening mammogram for malignant neoplasm of breast: Secondary | ICD-10-CM

## 2021-04-15 ENCOUNTER — Other Ambulatory Visit: Payer: Self-pay | Admitting: Hematology and Oncology

## 2021-04-16 ENCOUNTER — Ambulatory Visit: Payer: Medicare Other

## 2021-04-21 ENCOUNTER — Ambulatory Visit: Payer: Medicare Other | Admitting: Hematology and Oncology

## 2021-04-29 ENCOUNTER — Ambulatory Visit: Payer: Medicare Other | Admitting: Hematology and Oncology

## 2021-05-06 ENCOUNTER — Ambulatory Visit: Payer: Medicare Other | Admitting: Hematology and Oncology

## 2021-05-20 ENCOUNTER — Ambulatory Visit
Admission: RE | Admit: 2021-05-20 | Discharge: 2021-05-20 | Disposition: A | Discharge status: Home / Self Care | Payer: Medicare Other | Source: Ambulatory Visit | Attending: Hematology and Oncology | Admitting: Hematology and Oncology

## 2021-05-20 DIAGNOSIS — Z1231 Encounter for screening mammogram for malignant neoplasm of breast: Secondary | ICD-10-CM

## 2021-05-26 NOTE — Progress Notes (Signed)
Patient Care Team: Pcp, No as PCP - Dossie Arbour, MD (Inactive) (Gastroenterology) Jari Pigg, MD (Dermatology) Delice Bison, Charlestine Massed, NP as Nurse Practitioner (Hematology and Oncology) Fanny Skates, MD as Consulting Physician (General Surgery) Nicholas Lose, MD as Consulting Physician (Hematology and Oncology)  DIAGNOSIS:    ICD-10-CM   1. Malignant neoplasm of upper-outer quadrant of left breast in female, estrogen receptor positive ([place])  C50.412    Z17.0       SUMMARY OF ONCOLOGIC HISTORY: Oncology History  Breast cancer of upper-outer quadrant of left female breast ([place])  11/10/2015 Initial Diagnosis   Left breast biopsy 2:00, 1.5 x 0.7 x 0.7 cm : Grade 1, IDC with ADH ER 100%, PR 0%, Ki-67 10%, HER-2 neg ratio 1.28; 12:00: 2.8 cm Inv mamm ca grade 1-2,  ER 90%, PR 30%, Ki-67 10%, HER-2 neg ratio 1.15; total size 4.8 cm, T2 N0 stage II a clinical stage   11/26/2015 Genetic Testing   Genetic testing was normal, and did not reveal a deleterious mutation in these genes.  Additionally, no variants of uncertain significance (VUSes) were found. Genes tested include: ATM, BARD1, BRCA1, BRCA2, BRIP1, CDH1, CHEK2, FANCC, MLH1, MSH2, MSH6, NBN, PALB2, PMS2, PTEN, RAD51C, RAD51D, TP53, and XRCC2.  This panel also includes deletion/duplication analysis (without sequencing) for one gene, EPCAM.    12/18/2015 Surgery   Simple mastectomy Dalbert Batman): Invasive tubulolobular carcinoma, grade 1, 3.6 cm, low-grade DCIS, LCIS, ER 100% and 90%; PR 0% and 30%, HER-2 negative, Ki-67 10%, T2 NX stage II a    12/29/2015 Oncotype testing   Oncotype score 26, 17% ROR (intermediate)   01/2016 -  Anti-estrogen oral therapy   Anastrozole 54m daily   02/22/2016 Imaging   CT abdomen pelvis to evaluate pancreatic lesion: Less conspicuous uncinate process of pancreas lesion measuring 8 x 4 mm previously (11 x 8 mm)     CHIEF COMPLIANT: Follow-up of left breast cancer on anastrozole  therapy  INTERVAL HISTORY: Sherry Rice a 76y.o. with above-mentioned history of left breast cancer treated with mastectomy and is currently on antiestrogen therapy with anastrozole. Mammogram on 05/20/2021 showed no evidence of malignancy in the right breast. She presents to the clinic today for follow-up.   ALLERGIES:  is allergic to codeine.  MEDICATIONS:  Current Outpatient Medications  Medication Sig Dispense Refill   anastrozole (ARIMIDEX) 1 MG tablet TAKE 1 TABLET(1 MG) BY MOUTH DAILY 90 tablet 0   cholecalciferol (VITAMIN D) 1000 units tablet Take 1,000 Units by mouth daily.     Coenzyme Q10 (CO Q-10) 200 MG CAPS Take 1 tablet by mouth daily.  0   losartan-hydrochlorothiazide (HYZAAR) 50-12.5 MG tablet Take 1 tablet by mouth daily.     pyridOXINE (VITAMIN B-6) 50 MG tablet Take 1 tablet (50 mg total) daily by mouth.     Turmeric 1053 MG TABS Take 1,000 mg by mouth daily.     No current facility-administered medications for this visit.   Facility-Administered Medications Ordered in Other Visits  Medication Dose Route Frequency Provider Last Rate Last Admin   influenza vaccine adjuvanted (FLUAD) injection 0.5 mL  0.5 mL Intramuscular Once GNicholas Lose MD        PHYSICAL EXAMINATION: ECOG PERFORMANCE STATUS: 1 - Symptomatic but completely ambulatory  Vitals:   05/27/21 1421  BP: 130/78  Pulse: 70  Resp: 18  Temp: (!) 97.2 F (36.2 C)  SpO2: 99%   Filed Weights   05/27/21 1421  Weight: 130 lb  14.4 oz (59.4 kg)    BREAST: No palpable masses or nodules in either right or left breasts. No palpable axillary supraclavicular or infraclavicular adenopathy no breast tenderness or nipple discharge. (exam performed in the presence of a chaperone)  LABORATORY DATA:  I have reviewed the data as listed CMP Latest Ref Rng & Units 02/26/2016 11/25/2015 04/16/2013  Glucose 70 - 140 mg/dl 91 117 82  BUN 7.0 - 26.0 mg/dL 12.9 15.5 15  Creatinine 0.6 - 1.1 mg/dL 0.7 0.8  0.5  Sodium 136 - 145 mEq/L 141 142 138  Potassium 3.5 - 5.1 mEq/L 4.9 3.8 4.0  Chloride 96 - 112 mEq/L - - 103  CO2 22 - 29 mEq/L _0 Calcium 8.4 - 10.4 mg/dL 9.7 9.2 9.5  Total Protein 6.4 - 8.3 g/dL 7.5 6.8 -  Total Bilirubin 0.20 - 1.20 mg/dL 0.55 0.59 -  Alkaline Phos 40 - 150 U/L 85 82 -  AST 5 - 34 U/L 27 24 -  ALT 0 - 55 U/L 23 23 -    Lab Results  Component Value Date   WBC 10.0 02/26/2016   HGB 15.1 02/26/2016   HCT 45.0 02/26/2016   MCV 103.6 (H) 02/26/2016   PLT 216 02/26/2016   NEUTROABS 5.4 02/26/2016    ASSESSMENT & PLAN:  Breast cancer of upper-outer quadrant of left female breast ([place]) 11/10/2015: Left breast biopsy 2:00, 1.5 x 0.7 x 0.7 cm : Grade 1, IDC with ADH ER 100%, PR 0%, Ki-67 10%, HER-2 neg ratio 1.28; 12:00: 2.8 cm Inv mamm ca grade 1-2,  ER 90%, PR 30%, Ki-67 10%, HER-2 neg ratio 1.15; total size 4.8 cm, T2 N0 stage II a clinical stage   12/18/2015: Simple mastectomy: Invasive tubulolobular carcinoma, grade 1, 3.6 cm, low-grade DCIS, LCIS, ER 100% and 90%; PR 0% and 30%, HER-2 negative, Ki-67 10%, T2 NX stage II a    Oncotype DX score 26, 17% risk of recurrence with tamoxifen alone (intermediate risk) CT abdomen pelvis 02/26/2016: No evidence of metastatic disease    Treatment plan:  1. Adjuvant systemic chemotherapy with Taxotere and Cytoxan every 3 weeks 4 (patient has not finalized her decision regarding chemotherapy) 2. followed by adjuvant antiestrogen therapy with anastrozole 5-10 years (started September 2017) --------------------------------------------------------------------------------------------------------------------------------------------------------------- Anastrozole toxicities: Denies any hot flashes or myalgias. She is tolerating extremely well. She has trigger finger which is bothering her.  She will try and stop anastrozole for 2 weeks and see if that makes a difference. I discussed with her that 7 years of the maximum  antiestrogen therapy I would recommend.   Surveillance: 1. Breast exam 05/27/2021: Benign 2. Right Mammogram 05/20/2021: No evidence of malignancy .  Breast density category C   Renewed her prescription for anastrozole.    Return to clinic in 1 year for follow-up    No orders of the defined types were placed in this encounter.  The patient has a good understanding of the overall plan. she agrees with it. she will call with any problems that may develop before the next visit here.  Total time spent: 20 mins including face to face time and time spent for planning, charting and coordination of care  Rulon Eisenmenger, MD, MPH 05/27/2021  I, Thana Ates, am acting as scribe for Dr. Nicholas Lose.  I have reviewed the above documentation for accuracy and completeness, and I agree with the above.

## 2021-05-27 ENCOUNTER — Other Ambulatory Visit: Payer: Self-pay

## 2021-05-27 ENCOUNTER — Inpatient Hospital Stay: Payer: Medicare Other | Attending: Hematology and Oncology | Admitting: Hematology and Oncology

## 2021-05-27 DIAGNOSIS — C50412 Malignant neoplasm of upper-outer quadrant of left female breast: Secondary | ICD-10-CM | POA: Insufficient documentation

## 2021-05-27 DIAGNOSIS — Z170 Estrogen receptor positive status [ER+]: Secondary | ICD-10-CM

## 2021-05-27 DIAGNOSIS — Z9012 Acquired absence of left breast and nipple: Secondary | ICD-10-CM | POA: Insufficient documentation

## 2021-05-27 MED ORDER — ANASTROZOLE 1 MG PO TABS: ORAL_TABLET | ORAL | 3 refills | Status: DC

## 2021-05-27 NOTE — Assessment & Plan Note (Signed)
11/10/2015: Left breast biopsy 2:00, 1.5 x 0.7 x 0.7 cm : Grade 1, IDC with ADH ER 100%, PR 0%, Ki-67 10%, HER-2 neg ratio 1.28; 12:00: 2.8 cm Inv mamm ca grade 1-2, ER 90%, PR 30%, Ki-67 10%, HER-2 neg ratio 1.15; total size 4.8 cm, T2 N0 stage II a clinical stage  12/18/2015: Simple mastectomy: Invasive tubulolobular carcinoma, grade 1, 3.6 cm, low-grade DCIS, LCIS, ER 100% and 90%; PR 0% and 30%, HER-2 negative, Ki-67 10%, T2 NX stage II a   Oncotype DX score 26, 17% risk of recurrence with tamoxifen alone (intermediate risk) CT abdomen pelvis 02/26/2016: No evidence of metastatic disease  Treatment plan: 1.Adjuvant systemic chemotherapy with Taxotere and Cytoxan every 3 weeks 4(patient has not finalized her decision regarding chemotherapy) 2.followed by adjuvant antiestrogen therapy with anastrozole 5-10 years(started September 2017) --------------------------------------------------------------------------------------------------------------------------------------------------------------- Anastrozole toxicities: Denies any hot flashes or myalgias. She is tolerating extremely well.  Surveillance: 1.Breast exam1/26/2023: Benign 2.RightMammogram 05/20/2021: No evidence of malignancy.  Breast density category C  Renewed her prescription for anastrozole.   Return to clinic in 1 year for follow-up

## 2021-11-15 ENCOUNTER — Other Ambulatory Visit: Payer: Self-pay | Admitting: Family Medicine

## 2021-11-15 DIAGNOSIS — E2839 Other primary ovarian failure: Secondary | ICD-10-CM

## 2021-11-23 ENCOUNTER — Other Ambulatory Visit: Payer: Self-pay | Admitting: Family Medicine

## 2021-11-23 DIAGNOSIS — E2839 Other primary ovarian failure: Secondary | ICD-10-CM

## 2022-01-07 ENCOUNTER — Other Ambulatory Visit: Payer: Self-pay | Admitting: *Deleted

## 2022-01-07 DIAGNOSIS — Z122 Encounter for screening for malignant neoplasm of respiratory organs: Secondary | ICD-10-CM

## 2022-01-07 DIAGNOSIS — Z87891 Personal history of nicotine dependence: Secondary | ICD-10-CM

## 2022-01-27 ENCOUNTER — Ambulatory Visit (HOSPITAL_COMMUNITY)
Admission: RE | Admit: 2022-01-27 | Discharge: 2022-01-27 | Disposition: A | Discharge status: Home / Self Care | Payer: Medicare Other | Source: Ambulatory Visit | Attending: Acute Care | Admitting: Acute Care

## 2022-01-27 ENCOUNTER — Encounter: Payer: Self-pay | Admitting: Pulmonary Disease

## 2022-01-27 ENCOUNTER — Ambulatory Visit (INDEPENDENT_AMBULATORY_CARE_PROVIDER_SITE_OTHER): Payer: Medicare Other | Admitting: Pulmonary Disease

## 2022-01-27 DIAGNOSIS — Z122 Encounter for screening for malignant neoplasm of respiratory organs: Secondary | ICD-10-CM | POA: Diagnosis present

## 2022-01-27 DIAGNOSIS — Z87891 Personal history of nicotine dependence: Secondary | ICD-10-CM | POA: Insufficient documentation

## 2022-01-27 NOTE — Patient Instructions (Signed)
Thank you for participating in the [facility]. It was our pleasure to meet you today. We will call you with the results of your scan within the next few days. Your scan will be assigned a Lung RADS category score by the physicians reading the scans.  This Lung RADS score determines follow up scanning.  See below for description of categories, and follow up screening recommendations. We will be in touch to schedule your follow up screening annually or based on recommendations of our providers. We will fax a copy of your scan results to your Primary Care Physician, or the physician who referred you to the program, to ensure they have the results. Please call the office if you have any questions or concerns regarding your scanning experience or results.  Our office number is [phone]. Please speak with Denise Phelps, RN. , or  Denise Buckner RN, They are  our Lung Cancer Screening RN.'s If They are unavailable when you call, Please leave a message on the voice mail. We will return your call at our earliest convenience.This voice mail is monitored several times a day.  Remember, if your scan is normal, we will scan you annually as long as you continue to meet the criteria for the program. (Age 55-77, Current smoker or smoker who has quit within the last 15 years). If you are a smoker, remember, quitting is the single most powerful action that you can take to decrease your risk of lung cancer and other pulmonary, breathing related problems. We know quitting is hard, and we are here to help.  Please let us know if there is anything we can do to help you meet your goal of quitting. If you are a former smoker, congratulations. We are proud of you! Remain smoke free! Remember you can refer friends or family members through the number above.  We will screen them to make sure they meet criteria for the program. Thank you for helping us take better care of you by  participating in Lung Screening.  You can receive free nicotine replacement therapy ( patches, gum or mints) by calling 1-800-QUIT NOW. Please call so we can get you on the path to becoming  a non-smoker. I know it is hard, but you can do this!  Lung RADS Categories:  Lung RADS 1: no nodules or definitely non-concerning nodules.  Recommendation is for a repeat annual scan in 12 months.  Lung RADS 2:  nodules that are non-concerning in appearance and behavior with a very low likelihood of becoming an active cancer. Recommendation is for a repeat annual scan in 12 months.  Lung RADS 3: nodules that are probably non-concerning , includes nodules with a low likelihood of becoming an active cancer.  Recommendation is for a 6-month repeat screening scan. Often noted after an upper respiratory illness. We will be in touch to make sure you have no questions, and to schedule your 6-month scan.  Lung RADS 4 A: nodules with concerning findings, recommendation is most often for a follow up scan in 3 months or additional testing based on our provider's assessment of the scan. We will be in touch to make sure you have no questions and to schedule the recommended 3 month follow up scan.  Lung RADS 4 B:  indicates findings that are concerning. We will be in touch with you to schedule additional diagnostic testing based on our provider's  assessment of the scan.  Other options for assistance in smoking cessation (   As covered by your insurance benefits)  Hypnosis for smoking cessation  [employer] [phone]  Acupuncture for smoking cessation  [facility] [phone]   

## 2022-01-27 NOTE — Progress Notes (Signed)
Shared Decision Making Visit Lung Cancer Screening Program [phone])   Eligibility: Age 76 y.o. Pack Years Smoking History Calculation 50 (# packs/per year x # years smoked) Recent History of coughing up blood  no Unexplained weight loss? no ( >Than 15 pounds within the last 6 months ) Prior History Lung / other cancer yes - Breast Ca - 1997, 2017 (Diagnosis within the last 5 years already requiring surveillance chest CT Scans). Smoking Status Former Smoker Former Smokers: Years since quit: 4 years  Quit Date: 2019  Visit Components: Discussion included one or more decision making aids. yes Discussion included risk/benefits of screening. yes Discussion included potential follow up diagnostic testing for abnormal scans. yes Discussion included meaning and risk of over diagnosis. yes Discussion included meaning and risk of False Positives. yes Discussion included meaning of total radiation exposure. yes  Counseling Included: Importance of adherence to annual lung cancer LDCT screening. yes Impact of comorbidities on ability to participate in the program. yes Ability and willingness to under diagnostic treatment. yes  Smoking Cessation Counseling: Former Smokers:  Discussed the importance of maintaining cigarette abstinence. yes Diagnosis Code: Personal History of Nicotine Dependence. U04.540 Information about tobacco cessation classes and interventions provided to patient. Yes Patient provided with "ticket" for LDCT Scan. yes Written Order for Lung Cancer Screening with LDCT placed in Epic. Yes (CT Chest Lung Cancer Screening Low Dose W/O CM) [id number] Z12.2-Screening of respiratory organs Z87.891-Personal history of nicotine dependence   Lauraine Rinne, NP

## 2022-01-31 ENCOUNTER — Other Ambulatory Visit: Payer: Self-pay

## 2022-01-31 DIAGNOSIS — Z122 Encounter for screening for malignant neoplasm of respiratory organs: Secondary | ICD-10-CM

## 2022-01-31 DIAGNOSIS — Z87891 Personal history of nicotine dependence: Secondary | ICD-10-CM

## 2022-02-08 ENCOUNTER — Telehealth: Payer: Self-pay | Admitting: *Deleted

## 2022-02-08 NOTE — Telephone Encounter (Signed)
Patient called in wanting to discuss lung screening CT results. She did receive her result letter via [place]. I explained to pt that there were no suspicious or concerning nodules seen on her CT. Mild emphysema was noted. Coronary artery calcification was also seen. Pt is not on any cholesterol med at this time and was advised to discuss with her PCP at next appt. Pt verbalized understanding and had no further questions.

## 2022-04-08 ENCOUNTER — Other Ambulatory Visit: Payer: Self-pay | Admitting: Hematology and Oncology

## 2022-04-08 DIAGNOSIS — Z1231 Encounter for screening mammogram for malignant neoplasm of breast: Secondary | ICD-10-CM

## 2022-05-09 ENCOUNTER — Ambulatory Visit
Admission: RE | Admit: 2022-05-09 | Discharge: 2022-05-09 | Disposition: A | Discharge status: Home / Self Care | Payer: Medicare Other | Source: Ambulatory Visit | Attending: Family Medicine | Admitting: Family Medicine

## 2022-05-09 DIAGNOSIS — E2839 Other primary ovarian failure: Secondary | ICD-10-CM

## 2022-05-30 ENCOUNTER — Inpatient Hospital Stay: Payer: Medicare Other | Attending: Hematology and Oncology | Admitting: Hematology and Oncology

## 2022-05-30 VITALS — BP 142/89 | HR 72 | Temp 97.7°F | Resp 15 | Wt 126.2 lb

## 2022-05-30 DIAGNOSIS — Z79899 Other long term (current) drug therapy: Secondary | ICD-10-CM | POA: Insufficient documentation

## 2022-05-30 DIAGNOSIS — C50412 Malignant neoplasm of upper-outer quadrant of left female breast: Secondary | ICD-10-CM

## 2022-05-30 DIAGNOSIS — Z853 Personal history of malignant neoplasm of breast: Secondary | ICD-10-CM | POA: Diagnosis present

## 2022-05-30 DIAGNOSIS — M810 Age-related osteoporosis without current pathological fracture: Secondary | ICD-10-CM

## 2022-05-30 DIAGNOSIS — Z170 Estrogen receptor positive status [ER+]: Secondary | ICD-10-CM

## 2022-05-30 DIAGNOSIS — Z9012 Acquired absence of left breast and nipple: Secondary | ICD-10-CM | POA: Diagnosis not present

## 2022-05-30 NOTE — Assessment & Plan Note (Addendum)
11/10/2015: Left breast biopsy 2:00, 1.5 x 0.7 x 0.7 cm : Grade 1, IDC with ADH ER 100%, PR 0%, Ki-67 10%, HER-2 neg ratio 1.28; 12:00: 2.8 cm Inv mamm ca grade 1-2,  ER 90%, PR 30%, Ki-67 10%, HER-2 neg ratio 1.15; total size 4.8 cm, T2 N0 stage II a clinical stage   12/18/2015: Simple mastectomy: Invasive tubulolobular carcinoma, grade 1, 3.6 cm, low-grade DCIS, LCIS, ER 100% and 90%; PR 0% and 30%, HER-2 negative, Ki-67 10%, T2 NX stage II a    Oncotype DX score 26, 17% risk of recurrence with tamoxifen alone (intermediate risk) CT abdomen pelvis 02/26/2016: No evidence of metastatic disease    Treatment plan:  1. Adjuvant systemic chemotherapy with Taxotere and Cytoxan every 3 weeks 4 (patient has not finalized her decision regarding chemotherapy) 2. followed by adjuvant antiestrogen therapy with anastrozole (started September 2017- Dec 2023) --------------------------------------------------------------------------------------------------------------------------------------------------------------- Anastrozole toxicities: Denies any hot flashes or myalgias. She is tolerating extremely well. She has trigger finger which is bothering her.  She will try and stop anastrozole for 2 weeks and see if that makes a difference. I discussed with her that 7 years of the maximum antiestrogen therapy I would recommend. She will complete 7 years for September 2024.   Surveillance: 1. Right Mammogram 05/20/2021: No evidence of malignancy .  Breast density category C 2.  Bone density 05/09/2022: T-score -2.7: Osteoporosis: Recommended bisphosphonates along with calcium and vitamin D. Setting her up Prolia injections     Return to clinic in 1 year for follow-up

## 2022-05-30 NOTE — Progress Notes (Signed)
Patient Care Team: Saintclair Halsted, FNP as PCP - General (Family Medicine) Laurence Spates, MD (Inactive) (Gastroenterology) Jari Pigg, MD (Dermatology) Delice Bison Charlestine Massed, NP as Nurse Practitioner (Hematology and Oncology) Fanny Skates, MD as Consulting Physician (General Surgery) Nicholas Lose, MD as Consulting Physician (Hematology and Oncology)  DIAGNOSIS:  Encounter Diagnoses  Name Primary?   Malignant neoplasm of upper-outer quadrant of left breast in female, estrogen receptor positive ([place]) Yes   Age related osteoporosis, unspecified pathological fracture presence     SUMMARY OF ONCOLOGIC HISTORY: Oncology History  Breast cancer of upper-outer quadrant of left female breast ([place])  11/10/2015 Initial Diagnosis   Left breast biopsy 2:00, 1.5 x 0.7 x 0.7 cm : Grade 1, IDC with ADH ER 100%, PR 0%, Ki-67 10%, HER-2 neg ratio 1.28; 12:00: 2.8 cm Inv mamm ca grade 1-2,  ER 90%, PR 30%, Ki-67 10%, HER-2 neg ratio 1.15; total size 4.8 cm, T2 N0 stage II a clinical stage   11/26/2015 Genetic Testing   Genetic testing was normal, and did not reveal a deleterious mutation in these genes.  Additionally, no variants of uncertain significance (VUSes) were found. Genes tested include: ATM, BARD1, BRCA1, BRCA2, BRIP1, CDH1, CHEK2, FANCC, MLH1, MSH2, MSH6, NBN, PALB2, PMS2, PTEN, RAD51C, RAD51D, TP53, and XRCC2.  This panel also includes deletion/duplication analysis (without sequencing) for one gene, EPCAM.    12/18/2015 Surgery   Simple mastectomy Dalbert Batman): Invasive tubulolobular carcinoma, grade 1, 3.6 cm, low-grade DCIS, LCIS, ER 100% and 90%; PR 0% and 30%, HER-2 negative, Ki-67 10%, T2 NX stage II a    12/29/2015 Oncotype testing   Oncotype score 26, 17% ROR (intermediate)   01/2016 -  Anti-estrogen oral therapy   Anastrozole '1mg'$  daily   02/22/2016 Imaging   CT abdomen pelvis to evaluate pancreatic lesion: Less conspicuous uncinate process of pancreas lesion measuring 8 x 4 mm  previously (11 x 8 mm)     CHIEF COMPLIANT: Follow-up of left breast cancer on anastrozole therapy   INTERVAL HISTORY: Sherry Rice is a 77 y.o. with above-mentioned history of left breast cancer treated with mastectomy and is currently on antiestrogen therapy with anastrozole. She presents to the clinic for a follow-up. She reports that she fell and broke a bone in her foot. She also has arthritis in her fingers.    ALLERGIES:  is allergic to codeine.  MEDICATIONS:  Current Outpatient Medications  Medication Sig Dispense Refill   Calcium-Magnesium-Vitamin D (CALCIUM 1200+D3 PO)      tretinoin (RETIN-A) 0.1 % cream 1 application in the evening to face Externally Once a day     cholecalciferol (VITAMIN D) 1000 units tablet Take 1,000 Units by mouth daily.     Coenzyme Q10 (CO Q-10) 200 MG CAPS Take 1 tablet by mouth daily.  0   losartan-hydrochlorothiazide (HYZAAR) 50-12.5 MG tablet Take 1 tablet by mouth daily.     Na Sulfate-K Sulfate-Mg Sulf 17.5-3.13-1.6 GM/177ML SOLN MIX AND DRINK AS DIRECTED     pyridOXINE (VITAMIN B-6) 50 MG tablet Take 1 tablet (50 mg total) daily by mouth.     Turmeric 1053 MG TABS Take 1,000 mg by mouth daily.     No current facility-administered medications for this visit.   Facility-Administered Medications Ordered in Other Visits  Medication Dose Route Frequency Provider Last Rate Last Admin   influenza vaccine adjuvanted (FLUAD) injection 0.5 mL  0.5 mL Intramuscular Once Nicholas Lose, MD        PHYSICAL EXAMINATION: ECOG  PERFORMANCE STATUS: 1 - Symptomatic but completely ambulatory  Vitals:   05/30/22 1427  BP: (!) 142/89  Pulse: 72  Resp: 15  Temp: 97.7 F (36.5 C)  SpO2: 97%   Filed Weights   05/30/22 1427  Weight: 126 lb 3.2 oz (57.2 kg)      LABORATORY DATA:  I have reviewed the data as listed    Latest Ref Rng & Units 02/26/2016    8:59 AM 11/25/2015    2:03 PM 04/16/2013    5:32 PM  CMP  Glucose 70 - 140 mg/dl 91   117  82   BUN 7.0 - 26.0 mg/dL 12.9  15.5  15   Creatinine 0.6 - 1.1 mg/dL 0.7  0.8  0.5   Sodium 136 - 145 mEq/L 141  142  138   Potassium 3.5 - 5.1 mEq/L 4.9  3.8  4.0   Chloride 96 - 112 mEq/L   103   CO2 22 - 29 mEq/L '28  27  28   '$ Calcium 8.4 - 10.4 mg/dL 9.7  9.2  9.5   Total Protein 6.4 - 8.3 g/dL 7.5  6.8    Total Bilirubin 0.20 - 1.20 mg/dL 0.55  0.59    Alkaline Phos 40 - 150 U/L 85  82    AST 5 - 34 U/L 27  24    ALT 0 - 55 U/L 23  23      Lab Results  Component Value Date   WBC 10.0 02/26/2016   HGB 15.1 02/26/2016   HCT 45.0 02/26/2016   MCV 103.6 (H) 02/26/2016   PLT 216 02/26/2016   NEUTROABS 5.4 02/26/2016    ASSESSMENT & PLAN:  Breast cancer of upper-outer quadrant of left female breast ([place]) 11/10/2015: Left breast biopsy 2:00, 1.5 x 0.7 x 0.7 cm : Grade 1, IDC with ADH ER 100%, PR 0%, Ki-67 10%, HER-2 neg ratio 1.28; 12:00: 2.8 cm Inv mamm ca grade 1-2,  ER 90%, PR 30%, Ki-67 10%, HER-2 neg ratio 1.15; total size 4.8 cm, T2 N0 stage II a clinical stage   12/18/2015: Simple mastectomy: Invasive tubulolobular carcinoma, grade 1, 3.6 cm, low-grade DCIS, LCIS, ER 100% and 90%; PR 0% and 30%, HER-2 negative, Ki-67 10%, T2 NX stage II a    Oncotype DX score 26, 17% risk of recurrence with tamoxifen alone (intermediate risk) CT abdomen pelvis 02/26/2016: No evidence of metastatic disease    Treatment plan:  1. Adjuvant systemic chemotherapy with Taxotere and Cytoxan every 3 weeks 4 (patient has not finalized her decision regarding chemotherapy) 2. followed by adjuvant antiestrogen therapy with anastrozole (started September 2017- Dec 2023) --------------------------------------------------------------------------------------------------------------------------------------------------------------- Anastrozole discontinued December 2023   Surveillance: 1. Right Mammogram 05/20/2021: No evidence of malignancy .  Breast density category C 2.  Bone density 05/09/2022:  T-score -2.7: Osteoporosis: Recommended bisphosphonates along with calcium and vitamin D. Setting her up Prolia injections She will come every 6 months for Prolia injections with labs.    Return to clinic in 1 year for follow-up      No orders of the defined types were placed in this encounter.  The patient has a good understanding of the overall plan. she agrees with it. she will call with any problems that may develop before the next visit here. Total time spent: 30 mins including face to face time and time spent for planning, charting and co-ordination of care   Harriette Ohara, MD 05/30/22    I Gardiner Coins am  acting as a Education administrator for Dr.Kellan Boehlke  I have reviewed the above documentation for accuracy and completeness, and I agree with the above.

## 2022-06-03 ENCOUNTER — Ambulatory Visit
Admission: RE | Admit: 2022-06-03 | Discharge: 2022-06-03 | Disposition: A | Discharge status: Home / Self Care | Payer: Medicare Other | Source: Ambulatory Visit | Attending: Hematology and Oncology | Admitting: Hematology and Oncology

## 2022-06-03 DIAGNOSIS — Z1231 Encounter for screening mammogram for malignant neoplasm of breast: Secondary | ICD-10-CM

## 2022-06-08 ENCOUNTER — Encounter: Payer: Self-pay | Admitting: Hematology and Oncology

## 2022-06-20 ENCOUNTER — Telehealth: Payer: Self-pay | Admitting: Hematology and Oncology

## 2022-06-20 NOTE — Telephone Encounter (Signed)
Rescheduled appointments per staff message. Patient is aware of the changes made to her upcoming appointments.

## 2022-07-21 ENCOUNTER — Other Ambulatory Visit: Payer: Self-pay

## 2022-07-21 DIAGNOSIS — Z170 Estrogen receptor positive status [ER+]: Secondary | ICD-10-CM

## 2022-07-25 ENCOUNTER — Inpatient Hospital Stay: Payer: Medicare Other | Attending: Hematology and Oncology

## 2022-07-25 ENCOUNTER — Inpatient Hospital Stay: Payer: Medicare Other

## 2022-07-25 ENCOUNTER — Encounter: Payer: Self-pay | Admitting: *Deleted

## 2022-07-25 VITALS — BP 119/74 | HR 68 | Temp 98.4°F | Resp 16

## 2022-07-25 DIAGNOSIS — M810 Age-related osteoporosis without current pathological fracture: Secondary | ICD-10-CM | POA: Insufficient documentation

## 2022-07-25 DIAGNOSIS — Z170 Estrogen receptor positive status [ER+]: Secondary | ICD-10-CM

## 2022-07-25 DIAGNOSIS — Z853 Personal history of malignant neoplasm of breast: Secondary | ICD-10-CM | POA: Insufficient documentation

## 2022-07-25 LAB — CBC WITH DIFFERENTIAL (CANCER CENTER ONLY)
Abs Immature Granulocytes: 0.01 10*3/uL (ref 0.00–0.07)
Basophils Absolute: 0 10*3/uL (ref 0.0–0.1)
Basophils Relative: 1 %
Eosinophils Absolute: 0.1 10*3/uL (ref 0.0–0.5)
Eosinophils Relative: 2 %
HCT: 38.4 % (ref 36.0–46.0)
Hemoglobin: 13.6 g/dL (ref 12.0–15.0)
Immature Granulocytes: 0 %
Lymphocytes Relative: 40 %
Lymphs Abs: 2.5 10*3/uL (ref 0.7–4.0)
MCH: 33.9 pg (ref 26.0–34.0)
MCHC: 35.4 g/dL (ref 30.0–36.0)
MCV: 95.8 fL (ref 80.0–100.0)
Monocytes Absolute: 0.6 10*3/uL (ref 0.1–1.0)
Monocytes Relative: 10 %
Neutro Abs: 2.9 10*3/uL (ref 1.7–7.7)
Neutrophils Relative %: 47 %
Platelet Count: 224 10*3/uL (ref 150–400)
RBC: 4.01 MIL/uL (ref 3.87–5.11)
RDW: 12.3 % (ref 11.5–15.5)
WBC Count: 6.2 10*3/uL (ref 4.0–10.5)
nRBC: 0 % (ref 0.0–0.2)

## 2022-07-25 LAB — CMP (CANCER CENTER ONLY)
ALT: 22 U/L (ref 0–44)
AST: 24 U/L (ref 15–41)
Albumin: 4.4 g/dL (ref 3.5–5.0)
Alkaline Phosphatase: 78 U/L (ref 38–126)
Anion gap: 4 — ABNORMAL LOW (ref 5–15)
BUN: 19 mg/dL (ref 8–23)
CO2: 32 mmol/L (ref 22–32)
Calcium: 9.7 mg/dL (ref 8.9–10.3)
Chloride: 102 mmol/L (ref 98–111)
Creatinine: 0.6 mg/dL (ref 0.44–1.00)
GFR, Estimated: >60 mL/min (ref >60)
Glucose, Bld: 112 mg/dL — ABNORMAL HIGH (ref 70–99)
Potassium: 4.2 mmol/L (ref 3.5–5.1)
Sodium: 138 mmol/L (ref 135–145)
Total Bilirubin: 0.7 mg/dL (ref 0.3–1.2)
Total Protein: 6.6 g/dL (ref 6.5–8.1)

## 2022-07-25 MED ORDER — SODIUM CHLORIDE 0.9 % IV SOLN: Freq: Once | INTRAVENOUS | Status: DC

## 2022-07-25 MED ORDER — DENOSUMAB 60 MG/ML ~~LOC~~ SOSY
60.0000 mg | PREFILLED_SYRINGE | Freq: Once | SUBCUTANEOUS | Status: AC
  Administered 2022-07-25: 60 mg via SUBCUTANEOUS
  Filled 2022-07-25: qty 1

## 2022-07-25 NOTE — Patient Instructions (Signed)
Denosumab Injection (Osteoporosis) What is this medication? DENOSUMAB (den oh SUE mab) prevents and treats osteoporosis. It works by making your bones stronger and less likely to break (fracture). It is a monoclonal antibody. This medicine may be used for other purposes; ask your health care provider or pharmacist if you have questions. COMMON BRAND NAME(S): Prolia What should I tell my care team before I take this medication? They need to know if you have any of these conditions: Dental or gum disease, or plan to have dental surgery or a tooth pulled Infection Kidney disease Low levels of calcium or vitamin D in your blood On dialysis Poor nutrition Skin conditions Thyroid disease, or have had thyroid or parathyroid surgery Trouble absorbing minerals in your stomach or intestine An unusual or allergic reaction to denosumab, other medications, foods, dyes, or preservatives Pregnant or trying to get pregnant Breastfeeding How should I use this medication? This medication is injected under the skin. It is given by your care team in a hospital or clinic setting. A special MedGuide will be given to you before each treatment. Be sure to read this information carefully each time. Talk to your care team about the use of this medication in children. Special care may be needed. Overdosage: If you think you have taken too much of this medicine contact a poison control center or emergency room at once. NOTE: This medicine is only for you. Do not share this medicine with others. What if I miss a dose? Keep appointments for follow-up doses. It is important not to miss your dose. Call your care team if you are unable to keep an appointment. What may interact with this medication? Do not take this medication with any of the following: Other medications that contain denosumab This medication may also interact with the following: Medications that lower your chance of fighting infection Steroid  medications, such as prednisone or cortisone This list may not describe all possible interactions. Give your health care provider a list of all the medicines, herbs, non-prescription drugs, or dietary supplements you use. Also tell them if you smoke, drink alcohol, or use illegal drugs. Some items may interact with your medicine. What should I watch for while using this medication? Your condition will be monitored carefully while you are receiving this medication. You may need blood work while taking this medication. This medication may increase your risk of getting an infection. Call your care team for advice if you get a fever, chills, sore throat, or other symptoms of a cold or flu. Do not treat yourself. Try to avoid being around people who are sick. Tell your dentist and dental surgeon that you are taking this medication. You should not have major dental surgery while on this medication. See your dentist to have a dental exam and fix any dental problems before starting this medication. Take good care of your teeth while on this medication. Make sure you see your dentist for regular follow-up appointments. You should make sure you get enough calcium and vitamin D while you are taking this medication. Discuss the foods you eat and the vitamins you take with your care team. Talk to your care team if you are pregnant or think you might be pregnant. This medication can cause serious birth defects if taken during pregnancy and for 5 months after the last dose. You will need a negative pregnancy test before starting this medication. Contraception is recommended while taking this medication and for 5 months after the last dose. Your care   team can help you find the option that works for you. Talk to your care team before breastfeeding. Changes to your treatment plan may be needed. What side effects may I notice from receiving this medication? Side effects that you should report to your care team as soon as  possible: Allergic reactions--skin rash, itching, hives, swelling of the face, lips, tongue, or throat Infection--fever, chills, cough, sore throat, wounds that don't heal, pain or trouble when passing urine, general feeling of discomfort or being unwell Low calcium level--muscle pain or cramps, confusion, tingling, or numbness in the hands or feet Osteonecrosis of the jaw--pain, swelling, or redness in the mouth, numbness of the jaw, poor healing after dental work, unusual discharge from the mouth, visible bones in the mouth Severe bone, joint, or muscle pain Skin infection--skin redness, swelling, warmth, or pain Side effects that usually do not require medical attention (report these to your care team if they continue or are bothersome): Back pain Headache Joint pain Muscle pain Pain in the hands, arms, legs, or feet Runny or stuffy nose Sore throat This list may not describe all possible side effects. Call your doctor for medical advice about side effects. You may report side effects to FDA at 1-800-FDA-1088. Where should I keep my medication? This medication is given in a hospital or clinic. It will not be stored at home. NOTE: This sheet is a summary. It may not cover all possible information. If you have questions about this medicine, talk to your doctor, pharmacist, or health care provider.  2023 Elsevier/Gold Standard (2021-08-30 00:00:00)  

## 2022-07-25 NOTE — Progress Notes (Signed)
Received Dental Clearance from pt DDS to proceed with Prolia/ Xgeva/ Zometa treatments.  Clearance sent to HIM to be scanned into pt chart.

## 2022-11-28 ENCOUNTER — Inpatient Hospital Stay: Payer: Medicare Other

## 2023-01-26 ENCOUNTER — Inpatient Hospital Stay: Payer: Medicare Other | Attending: Hematology and Oncology | Admitting: Hematology and Oncology

## 2023-01-26 ENCOUNTER — Inpatient Hospital Stay: Payer: Medicare Other

## 2023-01-26 VITALS — BP 127/74 | HR 70 | Temp 97.5°F | Resp 18 | Ht 64.0 in | Wt 129.8 lb

## 2023-01-26 DIAGNOSIS — Z853 Personal history of malignant neoplasm of breast: Secondary | ICD-10-CM | POA: Diagnosis not present

## 2023-01-26 DIAGNOSIS — C50412 Malignant neoplasm of upper-outer quadrant of left female breast: Secondary | ICD-10-CM

## 2023-01-26 DIAGNOSIS — M810 Age-related osteoporosis without current pathological fracture: Secondary | ICD-10-CM | POA: Diagnosis present

## 2023-01-26 DIAGNOSIS — Z170 Estrogen receptor positive status [ER+]: Secondary | ICD-10-CM | POA: Diagnosis not present

## 2023-01-26 DIAGNOSIS — Z79899 Other long term (current) drug therapy: Secondary | ICD-10-CM | POA: Insufficient documentation

## 2023-01-26 LAB — CMP (CANCER CENTER ONLY)
ALT: 20 U/L (ref 0–44)
AST: 22 U/L (ref 15–41)
Albumin: 4.3 g/dL (ref 3.5–5.0)
Alkaline Phosphatase: 65 U/L (ref 38–126)
Anion gap: 5 (ref 5–15)
BUN: 21 mg/dL (ref 8–23)
CO2: 31 mmol/L (ref 22–32)
Calcium: 9.6 mg/dL (ref 8.9–10.3)
Chloride: 103 mmol/L (ref 98–111)
Creatinine: 0.6 mg/dL (ref 0.44–1.00)
GFR, Estimated: >60 mL/min (ref >60)
Glucose, Bld: 100 mg/dL — ABNORMAL HIGH (ref 70–99)
Potassium: 4.5 mmol/L (ref 3.5–5.1)
Sodium: 139 mmol/L (ref 135–145)
Total Bilirubin: 0.7 mg/dL (ref 0.3–1.2)
Total Protein: 6.8 g/dL (ref 6.5–8.1)

## 2023-01-26 LAB — CBC WITH DIFFERENTIAL (CANCER CENTER ONLY)
Abs Immature Granulocytes: 0.02 10*3/uL (ref 0.00–0.07)
Basophils Absolute: 0 10*3/uL (ref 0.0–0.1)
Basophils Relative: 1 %
Eosinophils Absolute: 0.1 10*3/uL (ref 0.0–0.5)
Eosinophils Relative: 2 %
HCT: 39.5 % (ref 36.0–46.0)
Hemoglobin: 13.5 g/dL (ref 12.0–15.0)
Immature Granulocytes: 0 %
Lymphocytes Relative: 39 %
Lymphs Abs: 2.4 10*3/uL (ref 0.7–4.0)
MCH: 33.4 pg (ref 26.0–34.0)
MCHC: 34.2 g/dL (ref 30.0–36.0)
MCV: 97.8 fL (ref 80.0–100.0)
Monocytes Absolute: 0.7 10*3/uL (ref 0.1–1.0)
Monocytes Relative: 11 %
Neutro Abs: 2.9 10*3/uL (ref 1.7–7.7)
Neutrophils Relative %: 47 %
Platelet Count: 205 10*3/uL (ref 150–400)
RBC: 4.04 MIL/uL (ref 3.87–5.11)
RDW: 12.3 % (ref 11.5–15.5)
WBC Count: 6.2 10*3/uL (ref 4.0–10.5)
nRBC: 0 % (ref 0.0–0.2)

## 2023-01-26 MED ORDER — DENOSUMAB 60 MG/ML ~~LOC~~ SOSY
60.0000 mg | PREFILLED_SYRINGE | Freq: Once | SUBCUTANEOUS | Status: AC
  Administered 2023-01-26: 60 mg via SUBCUTANEOUS
  Filled 2023-01-26: qty 1

## 2023-01-26 NOTE — Progress Notes (Signed)
Patient Care Team: Camie Patience, FNP as PCP - General (Family Medicine) Carman Ching, MD (Inactive) (Gastroenterology) Elmon Else, MD (Dermatology) Axel Filler Larna Daughters, NP as Nurse Practitioner (Hematology and Oncology) Claud Kelp, MD as Consulting Physician (General Surgery) Serena Croissant, MD as Consulting Physician (Hematology and Oncology)  DIAGNOSIS:  Encounter Diagnosis  Name Primary?   Malignant neoplasm of upper-outer quadrant of left breast in female, estrogen receptor positive (HCC) Yes    SUMMARY OF ONCOLOGIC HISTORY: Oncology History  Breast cancer of upper-outer quadrant of left female breast (HCC)  11/10/2015 Initial Diagnosis   Left breast biopsy 2:00, 1.5 x 0.7 x 0.7 cm : Grade 1, IDC with ADH ER 100%, PR 0%, Ki-67 10%, HER-2 neg ratio 1.28; 12:00: 2.8 cm Inv mamm ca grade 1-2,  ER 90%, PR 30%, Ki-67 10%, HER-2 neg ratio 1.15; total size 4.8 cm, T2 N0 stage II a clinical stage   11/26/2015 Genetic Testing   Genetic testing was normal, and did not reveal a deleterious mutation in these genes.  Additionally, no variants of uncertain significance (VUSes) were found. Genes tested include: ATM, BARD1, BRCA1, BRCA2, BRIP1, CDH1, CHEK2, FANCC, MLH1, MSH2, MSH6, NBN, PALB2, PMS2, PTEN, RAD51C, RAD51D, TP53, and XRCC2.  This panel also includes deletion/duplication analysis (without sequencing) for one gene, EPCAM.    12/18/2015 Surgery   Simple mastectomy Derrell Lolling): Invasive tubulolobular carcinoma, grade 1, 3.6 cm, low-grade DCIS, LCIS, ER 100% and 90%; PR 0% and 30%, HER-2 negative, Ki-67 10%, T2 NX stage II a    12/29/2015 Oncotype testing   Oncotype score 26, 17% ROR (intermediate)   01/2016 -  Anti-estrogen oral therapy   Anastrozole 1mg  daily   02/22/2016 Imaging   CT abdomen pelvis to evaluate pancreatic lesion: Less conspicuous uncinate process of pancreas lesion measuring 8 x 4 mm previously (11 x 8 mm)     CHIEF COMPLIANT: Follow-up on  Prolia  Discussed the use of AI scribe software for clinical note transcription with the patient, who gave verbal consent to proceed.  History of Present Illness   The patient, a seven-year breast cancer survivor, is currently on Prolia for bone health. She reports no issues with the first injection and is due for her second shot. She has not experienced any breast pain or discomfort. The patient has been taking calcium supplements, but has concerns about potential side effects such as kidney stones and hardening of arteries. She has been advised to consume dietary calcium and has incorporated almonds and yogurt into her diet. She also takes Tums as a source of calcium.  The patient recently celebrated a milestone birthday and maintains an active lifestyle, walking four miles daily and lifting weights three times a week. She has a history of trigger finger, for which she received a cortisone shot. She reports improvement in her condition and is able to straighten her fingers. She has a minor issue with one finger, but it is not causing her pain.     ALLERGIES:  is allergic to codeine.  MEDICATIONS:  Current Outpatient Medications  Medication Sig Dispense Refill   Calcium-Magnesium-Vitamin D (CALCIUM 1200+D3 PO)      cholecalciferol (VITAMIN D) 1000 units tablet Take 1,000 Units by mouth daily.     Coenzyme Q10 (CO Q-10) 200 MG CAPS Take 1 tablet by mouth daily.  0   losartan-hydrochlorothiazide (HYZAAR) 50-12.5 MG tablet Take 1 tablet by mouth daily.     Na Sulfate-K Sulfate-Mg Sulf 17.5-3.13-1.6 GM/177ML SOLN MIX AND DRINK AS  DIRECTED     pyridOXINE (VITAMIN B-6) 50 MG tablet Take 1 tablet (50 mg total) daily by mouth.     tretinoin (RETIN-A) 0.1 % cream 1 application in the evening to face Externally Once a day     Turmeric 1053 MG TABS Take 1,000 mg by mouth daily.     No current facility-administered medications for this visit.   Facility-Administered Medications Ordered in Other Visits   Medication Dose Route Frequency Provider Last Rate Last Admin   influenza vaccine adjuvanted (FLUAD) injection 0.5 mL  0.5 mL Intramuscular Once Serena Croissant, MD        PHYSICAL EXAMINATION: ECOG PERFORMANCE STATUS: 1 - Symptomatic but completely ambulatory  Vitals:   01/26/23 1446  BP: 127/74  Pulse: 70  Resp: 18  Temp: (!) 97.5 F (36.4 C)  SpO2: 99%   Filed Weights   01/26/23 1446  Weight: 129 lb 12.8 oz (58.9 kg)     LABORATORY DATA:  I have reviewed the data as listed    Latest Ref Rng & Units 01/26/2023    2:25 PM 07/25/2022    1:56 PM 02/26/2016    8:59 AM  CMP  Glucose 70 - 99 mg/dL 016  010  91   BUN 8 - 23 mg/dL 21  19  93.2   Creatinine 0.44 - 1.00 mg/dL 3.55  7.32  0.7   Sodium 135 - 145 mmol/L 139  138  141   Potassium 3.5 - 5.1 mmol/L 4.5  4.2  4.9   Chloride 98 - 111 mmol/L 103  102    CO2 22 - 32 mmol/L 31  32  28   Calcium 8.9 - 10.3 mg/dL 9.6  9.7  9.7   Total Protein 6.5 - 8.1 g/dL 6.8  6.6  7.5   Total Bilirubin 0.3 - 1.2 mg/dL 0.7  0.7  2.02   Alkaline Phos 38 - 126 U/L 65  78  85   AST 15 - 41 U/L 22  24  27    ALT 0 - 44 U/L 20  22  23      Lab Results  Component Value Date   WBC 6.2 01/26/2023   HGB 13.5 01/26/2023   HCT 39.5 01/26/2023   MCV 97.8 01/26/2023   PLT 205 01/26/2023   NEUTROABS 2.9 01/26/2023    ASSESSMENT & PLAN:  Breast cancer of upper-outer quadrant of left female breast (HCC) 11/10/2015: Left breast biopsy 2:00, 1.5 x 0.7 x 0.7 cm : Grade 1, IDC with ADH ER 100%, PR 0%, Ki-67 10%, HER-2 neg ratio 1.28; 12:00: 2.8 cm Inv mamm ca grade 1-2,  ER 90%, PR 30%, Ki-67 10%, HER-2 neg ratio 1.15; total size 4.8 cm, T2 N0 stage II a clinical stage   12/18/2015: Simple mastectomy: Invasive tubulolobular carcinoma, grade 1, 3.6 cm, low-grade DCIS, LCIS, ER 100% and 90%; PR 0% and 30%, HER-2 negative, Ki-67 10%, T2 NX stage II a    Oncotype DX score 26, 17% risk of recurrence with tamoxifen alone (intermediate risk) CT abdomen  pelvis 02/26/2016: No evidence of metastatic disease    Treatment plan:  1. Adjuvant systemic chemotherapy: Patient refused 2. Adjuvant antiestrogen therapy with anastrozole (started September 2017- Dec 2023) --------------------------------------------------------------------------------------------------------------------------------------------------------------- Anastrozole discontinued December 2023   Surveillance: 1. Right Mammogram 06/07/2022: No evidence of malignancy .  Breast density category C 2.  Bone density 05/09/2022: T-score -2.7: Osteoporosis: Recommended bisphosphonates along with calcium and vitamin D. Setting her up Prolia injections She will come  every 6 months for Prolia injections with labs.      Breast Cancer Survivorship Seven years post-diagnosis, no current breast pain or discomfort. -Continue annual mammograms, next scheduled for February 2026.  Osteoporosis On Prolia for bone health, no issues with first injection. Calcium intake through diet and Tums. -Continue Prolia injections every six months. -Next bone density scan in January 2026 to assess need for continued Prolia.  Trigger Finger Previous issue resolved with cortisone shot, new issue with another finger but currently not painful. -No immediate intervention needed, monitor for changes.  General Health Maintenance -Continue regular exercise and weight lifting for overall health and bone strength. -Continue dietary calcium intake through almonds, yogurt, and Tums. -Six-month follow-up for next Prolia injection and annual follow-up with oncologist.          No orders of the defined types were placed in this encounter.  The patient has a good understanding of the overall plan. she agrees with it. she will call with any problems that may develop before the next visit here. Total time spent: 30 mins including face to face time and time spent for planning, charting and co-ordination of care   Tamsen Meek, MD 01/26/23

## 2023-01-26 NOTE — Assessment & Plan Note (Addendum)
11/10/2015: Left breast biopsy 2:00, 1.5 x 0.7 x 0.7 cm : Grade 1, IDC with ADH ER 100%, PR 0%, Ki-67 10%, HER-2 neg ratio 1.28; 12:00: 2.8 cm Inv mamm ca grade 1-2,  ER 90%, PR 30%, Ki-67 10%, HER-2 neg ratio 1.15; total size 4.8 cm, T2 N0 stage II a clinical stage   12/18/2015: Simple mastectomy: Invasive tubulolobular carcinoma, grade 1, 3.6 cm, low-grade DCIS, LCIS, ER 100% and 90%; PR 0% and 30%, HER-2 negative, Ki-67 10%, T2 NX stage II a    Oncotype DX score 26, 17% risk of recurrence with tamoxifen alone (intermediate risk) CT abdomen pelvis 02/26/2016: No evidence of metastatic disease    Treatment plan:  1. Adjuvant systemic chemotherapy: Patient refused 2. Adjuvant antiestrogen therapy with anastrozole (started September 2017- Dec 2023) --------------------------------------------------------------------------------------------------------------------------------------------------------------- Anastrozole discontinued December 2023   Surveillance: 1. Right Mammogram 06/07/2022: No evidence of malignancy .  Breast density category C 2.  Bone density 05/09/2022: T-score -2.7: Osteoporosis: Recommended bisphosphonates along with calcium and vitamin D. Setting her up Prolia injections She will come every 6 months for Prolia injections with labs.    Return to clinic in 1 year for follow-up

## 2023-01-30 ENCOUNTER — Ambulatory Visit (HOSPITAL_COMMUNITY)
Admission: RE | Admit: 2023-01-30 | Discharge: 2023-01-30 | Disposition: A | Discharge status: Home / Self Care | Payer: Medicare Other | Source: Ambulatory Visit | Attending: Acute Care | Admitting: Acute Care

## 2023-01-30 DIAGNOSIS — Z87891 Personal history of nicotine dependence: Secondary | ICD-10-CM | POA: Insufficient documentation

## 2023-01-30 DIAGNOSIS — J439 Emphysema, unspecified: Secondary | ICD-10-CM | POA: Diagnosis not present

## 2023-01-30 DIAGNOSIS — Z122 Encounter for screening for malignant neoplasm of respiratory organs: Secondary | ICD-10-CM | POA: Insufficient documentation

## 2023-01-30 DIAGNOSIS — I700 Atherosclerosis of aorta: Secondary | ICD-10-CM | POA: Insufficient documentation

## 2023-01-30 DIAGNOSIS — I2510 Atherosclerotic heart disease of native coronary artery without angina pectoris: Secondary | ICD-10-CM | POA: Diagnosis not present

## 2023-02-08 ENCOUNTER — Emergency Department (HOSPITAL_COMMUNITY)
Admission: EM | Admit: 2023-02-08 | Discharge: 2023-02-08 | Disposition: A | Discharge status: Home / Self Care | Payer: Medicare Other | Attending: Emergency Medicine | Admitting: Emergency Medicine

## 2023-02-08 ENCOUNTER — Other Ambulatory Visit: Payer: Self-pay

## 2023-02-08 ENCOUNTER — Encounter (HOSPITAL_COMMUNITY): Payer: Self-pay

## 2023-02-08 ENCOUNTER — Emergency Department (HOSPITAL_COMMUNITY): Payer: Medicare Other

## 2023-02-08 DIAGNOSIS — S6992XA Unspecified injury of left wrist, hand and finger(s), initial encounter: Secondary | ICD-10-CM | POA: Diagnosis present

## 2023-02-08 DIAGNOSIS — Y92830 Public park as the place of occurrence of the external cause: Secondary | ICD-10-CM | POA: Diagnosis not present

## 2023-02-08 DIAGNOSIS — S52612A Displaced fracture of left ulna styloid process, initial encounter for closed fracture: Secondary | ICD-10-CM | POA: Insufficient documentation

## 2023-02-08 DIAGNOSIS — S42401A Unspecified fracture of lower end of right humerus, initial encounter for closed fracture: Secondary | ICD-10-CM

## 2023-02-08 DIAGNOSIS — M7989 Other specified soft tissue disorders: Secondary | ICD-10-CM | POA: Insufficient documentation

## 2023-02-08 DIAGNOSIS — Y9301 Activity, walking, marching and hiking: Secondary | ICD-10-CM | POA: Insufficient documentation

## 2023-02-08 DIAGNOSIS — R799 Abnormal finding of blood chemistry, unspecified: Secondary | ICD-10-CM | POA: Insufficient documentation

## 2023-02-08 DIAGNOSIS — S52021A Displaced fracture of olecranon process without intraarticular extension of right ulna, initial encounter for closed fracture: Secondary | ICD-10-CM | POA: Diagnosis not present

## 2023-02-08 DIAGNOSIS — W19XXXA Unspecified fall, initial encounter: Secondary | ICD-10-CM | POA: Insufficient documentation

## 2023-02-08 DIAGNOSIS — S0081XA Abrasion of other part of head, initial encounter: Secondary | ICD-10-CM | POA: Diagnosis not present

## 2023-02-08 DIAGNOSIS — S0990XA Unspecified injury of head, initial encounter: Secondary | ICD-10-CM | POA: Insufficient documentation

## 2023-02-08 DIAGNOSIS — S62102A Fracture of unspecified carpal bone, left wrist, initial encounter for closed fracture: Secondary | ICD-10-CM

## 2023-02-08 DIAGNOSIS — S52502A Unspecified fracture of the lower end of left radius, initial encounter for closed fracture: Secondary | ICD-10-CM | POA: Diagnosis not present

## 2023-02-08 LAB — CBG MONITORING, ED: Glucose-Capillary: 113 mg/dL — ABNORMAL HIGH (ref 70–99)

## 2023-02-08 MED ORDER — TETANUS-DIPHTH-ACELL PERTUSSIS 5-2.5-18.5 LF-MCG/0.5 IM SUSY
0.5000 mL | PREFILLED_SYRINGE | Freq: Once | INTRAMUSCULAR | Status: DC
  Filled 2023-02-08: qty 0.5

## 2023-02-08 MED ORDER — OXYCODONE HCL 5 MG PO TABS
5.0000 mg | ORAL_TABLET | Freq: Four times a day (QID) | ORAL | 0 refills | Status: AC | PRN
Start: 2023-02-08 — End: ?

## 2023-02-08 MED ORDER — ONDANSETRON 4 MG PO TBDP
4.0000 mg | ORAL_TABLET | Freq: Once | ORAL | Status: AC
  Administered 2023-02-08: 4 mg via ORAL
  Filled 2023-02-08: qty 1

## 2023-02-08 MED ORDER — NAPROXEN 375 MG PO TABS
375.0000 mg | ORAL_TABLET | Freq: Two times a day (BID) | ORAL | 0 refills | Status: AC
Start: 2023-02-08 — End: ?

## 2023-02-08 MED ORDER — CEPHALEXIN 500 MG PO CAPS
1000.0000 mg | ORAL_CAPSULE | Freq: Once | ORAL | Status: AC
  Administered 2023-02-08: 1000 mg via ORAL
  Filled 2023-02-08: qty 2

## 2023-02-08 MED ORDER — CEPHALEXIN 500 MG PO CAPS: ORAL_CAPSULE | ORAL | 0 refills | Status: AC

## 2023-02-08 MED ORDER — OXYCODONE-ACETAMINOPHEN 5-325 MG PO TABS
1.0000 | ORAL_TABLET | Freq: Once | ORAL | Status: AC
  Administered 2023-02-08: 1 via ORAL
  Filled 2023-02-08: qty 1

## 2023-02-08 MED ORDER — OXYCODONE HCL 5 MG PO TABS
10.0000 mg | ORAL_TABLET | ORAL | Status: DC | PRN
  Administered 2023-02-08: 10 mg via ORAL
  Filled 2023-02-08: qty 2

## 2023-02-08 NOTE — Discharge Instructions (Addendum)
Contact a health care provider if you have: Pain that gets worse or does not improve. Get help right away if you have: Numbness or weakness in your elbow, hand, or fingers that does not go away. A change in the color of your fingers. Severe pain that is much worse or different than before. Swelling that suddenly gets worse.

## 2023-02-08 NOTE — ED Provider Notes (Signed)
[place] EMERGENCY DEPARTMENT AT [facility] Provider Note   CSN: [id number] Arrival date & time: 02/08/23  1127     History  No chief complaint on file.   Lylee Corrow is a 77 y.o. female [place]. Patient had immediate pain and f=deformity to her left wrist and right elbow. She also hit her face. No LOC, no blood thinners, R hand dominanat  HPI     Home Medications Prior to Admission medications   Medication Sig Start Date End Date Taking? Authorizing Provider  Calcium-Magnesium-Vitamin D (CALCIUM 1200+D3 PO)  05/04/22   [provider]  cholecalciferol (VITAMIN D) 1000 units tablet Take 1,000 Units by mouth daily.    [provider]  Coenzyme Q10 (CO Q-10) 200 MG CAPS Take 1 tablet by mouth daily. 03/05/18   Serena Croissant, MD  losartan-hydrochlorothiazide (HYZAAR) 50-12.5 MG tablet Take 1 tablet by mouth daily. 04/14/20   Serena Croissant, MD  Na Sulfate-K Sulfate-Mg Sulf 17.5-3.13-1.6 GM/177ML SOLN MIX AND DRINK AS DIRECTED    [provider]  pyridOXINE (VITAMIN B-6) 50 MG tablet Take 1 tablet (50 mg total) daily by mouth. 03/06/17   Serena Croissant, MD  tretinoin (RETIN-A) 0.1 % cream 1 application in the evening to face Externally Once a day 05/27/22   [provider]  Turmeric 1053 MG TABS Take 1,000 mg by mouth daily. 03/21/19   Serena Croissant, MD      Allergies    Codeine    Review of Systems   Review of Systems  Physical Exam Updated Vital Signs BP (!) 137/95 (BP Location: Right Arm)   Pulse 62   Temp 98 F (36.7 C) (Oral)   Resp 16   Ht 5\' 4"  (1.626 m)   Wt 53.9 kg   SpO2 98%   BMI 20.39 kg/m  Physical Exam Vitals and nursing note reviewed.  Constitutional:      General: She is not in acute distress.    Appearance: She is well-developed. She is not diaphoretic.  HENT:     Head: Normocephalic.     Comments: Small abrasions to face no facial deformities    Right Ear:  External ear normal.     Left Ear: External ear normal.     Nose: Nose normal.     Mouth/Throat:     Mouth: Mucous membranes are moist.  Eyes:     General: No scleral icterus.    Conjunctiva/sclera: Conjunctivae normal.  Cardiovascular:     Rate and Rhythm: Normal rate and regular rhythm.     Heart sounds: Normal heart sounds. No murmur heard.    No friction rub. No gallop.  Pulmonary:     Effort: Pulmonary effort is normal. No respiratory distress.     Breath sounds: Normal breath sounds.  Abdominal:     General: Bowel sounds are normal. There is no distension.     Palpations: Abdomen is soft. There is no mass.     Tenderness: There is no abdominal tenderness. There is no guarding.  Musculoskeletal:        General: Swelling and deformity present.     Cervical back: Normal range of motion.     Comments: Left wrist with small cut on the volar surface, mild oozing, large hematoma, obvious deformity. Wiggles fingers with pain. 2* radial pulse Sensation intact Left Elbow with large Hematoma. Unable to move due to pain  Skin:    General: Skin is warm  and dry.  Neurological:     Mental Status: She is alert and oriented to person, place, and time.  Psychiatric:        Behavior: Behavior normal.     ED Results / Procedures / Treatments   Labs (all labs ordered are listed, but only abnormal results are displayed) Labs Reviewed  CBG MONITORING, ED - Abnormal; Notable for the following components:      Result Value   Glucose-Capillary 113 (*)    All other components within normal limits    EKG None  Radiology CT Head Code Stroke WO Contrast  Result Date: 02/08/2023 CLINICAL DATA:  Code stroke. Neuro deficit, acute, stroke suspected; Polytrauma, blunt EXAM: CT HEAD WITHOUT CONTRAST CT CERVICAL SPINE WITHOUT CONTRAST TECHNIQUE: Multidetector CT imaging of the head and cervical spine was performed following the standard protocol without intravenous contrast. Multiplanar CT image  reconstructions of the cervical spine were also generated. RADIATION DOSE REDUCTION: This exam was performed according to the departmental dose-optimization program which includes automated exposure control, adjustment of the mA and/or kV according to patient size and/or use of iterative reconstruction technique. COMPARISON:  None Available. FINDINGS: CT HEAD FINDINGS Brain: No evidence of acute infarction, hemorrhage, hydrocephalus, extra-axial collection or mass lesion/mass effect. Vascular: No hyperdense vessel identified. Skull: No acute fracture. Sinuses/Orbits: Clear sinuses.  No acute orbital findings. Other: No mastoid effusions. CT CERVICAL SPINE FINDINGS Alignment: Reversal the normal cervical lordosis. Anterolisthesis of C2 on C3, likely degenerative given severe facet arthropathy at this level. Skull base and vertebrae: No evidence of acute fracture. Vertebral body heights are maintained. Ankylosis across multiple levels. Osteopenia. Soft tissues and spinal canal: No prevertebral fluid or swelling. No visible canal hematoma. Disc levels: Multilevel degenerative disc disease with facet and uncovertebral hypertrophy. Upper chest: Visualized lung apices are clear. Other: Subcentimeter right thyroid nodule which does not require further imaging follow-up per current guidelines (ref: J Am Coll Radiol. 2015 Feb;12(2): 143-50). IMPRESSION: No evidence of acute abnormality intracranially or in the cervical spine. Electronically Signed   By: Feliberto Harts M.D.   On: 02/08/2023 13:46   CT Cervical Spine Wo Contrast  Result Date: 02/08/2023 CLINICAL DATA:  Code stroke. Neuro deficit, acute, stroke suspected; Polytrauma, blunt EXAM: CT HEAD WITHOUT CONTRAST CT CERVICAL SPINE WITHOUT CONTRAST TECHNIQUE: Multidetector CT imaging of the head and cervical spine was performed following the standard protocol without intravenous contrast. Multiplanar CT image reconstructions of the cervical spine were also  generated. RADIATION DOSE REDUCTION: This exam was performed according to the departmental dose-optimization program which includes automated exposure control, adjustment of the mA and/or kV according to patient size and/or use of iterative reconstruction technique. COMPARISON:  None Available. FINDINGS: CT HEAD FINDINGS Brain: No evidence of acute infarction, hemorrhage, hydrocephalus, extra-axial collection or mass lesion/mass effect. Vascular: No hyperdense vessel identified. Skull: No acute fracture. Sinuses/Orbits: Clear sinuses.  No acute orbital findings. Other: No mastoid effusions. CT CERVICAL SPINE FINDINGS Alignment: Reversal the normal cervical lordosis. Anterolisthesis of C2 on C3, likely degenerative given severe facet arthropathy at this level. Skull base and vertebrae: No evidence of acute fracture. Vertebral body heights are maintained. Ankylosis across multiple levels. Osteopenia. Soft tissues and spinal canal: No prevertebral fluid or swelling. No visible canal hematoma. Disc levels: Multilevel degenerative disc disease with facet and uncovertebral hypertrophy. Upper chest: Visualized lung apices are clear. Other: Subcentimeter right thyroid nodule which does not require further imaging follow-up per current guidelines (ref: J Am Coll Radiol. 2015 Feb;12(2): 143-50).  IMPRESSION: No evidence of acute abnormality intracranially or in the cervical spine. Electronically Signed   By: Feliberto Harts M.D.   On: 02/08/2023 13:46    Procedures .Splint Application  Date/Time: 02/09/2023 10:22 AM  Performed by: Lou Cal Authorized by: Arthor Captain, PA-C   Consent:    Consent obtained:  Verbal   Consent given by:  Patient   Risks discussed:  Discoloration, numbness, pain and swelling   Alternatives discussed:  No treatment Universal protocol:    Patient identity confirmed:  Arm band Pre-procedure details:    Distal neurologic exam:  Normal   Distal perfusion: distal pulses strong  and brisk capillary refill   Procedure details:    Location:  Wrist   Wrist location:  L wrist   Splint type:  Sugar tong   Supplies:  Fiberglass, elastic bandage and cotton padding Post-procedure details:    Procedure completion:  Tolerated well, no immediate complications .Splint Application  Date/Time: 02/09/2023 10:25 AM  Performed by: Arthor Captain, PA-C Authorized by: Arthor Captain, PA-C   Procedure details:    Location:  Elbow   Elbow location:  R elbow   Splint type:  Long arm   Supplies:  Elastic bandage, fiberglass, cotton padding and sling Post-procedure details:    Distal neurologic exam:  Normal   Procedure completion:  Tolerated well, no immediate complications     Medications Ordered in ED Medications  Tdap (BOOSTRIX) injection 0.5 mL (0.5 mLs Intramuscular Patient Refused/Not Given 02/08/23 1300)  oxyCODONE-acetaminophen (PERCOCET/ROXICET) 5-325 MG per tablet 1 tablet (1 tablet Oral Given 02/08/23 1257)  ondansetron (ZOFRAN-ODT) disintegrating tablet 4 mg (4 mg Oral Given 02/08/23 1257)    ED Course/ Medical Decision Making/ A&P Clinical Course as of 02/08/23 1456  Wed Feb 08, 2023  1426 DG Wrist Complete Left [AH]  1426 DG Elbow 2 Views Right I visualized and interpreted left wrist and right elbow x-ray.  Patient has significant fracture deformities of both.  These are closed fractures. Discussed with PA Earney Hamburg who recommends sugar-tong for the left and long-arm posterior splint for the right.. I have concerns that patient may have difficulty managing at home given the fact that both of her extremities are involved.  Will reevaluate after splint placement. [AH]    Clinical Course User Index [AH] Arthor Captain, PA-C                                 Medical Decision Making Patient here with mechanical fall. I personally visualized and interpreted the images using our PACS system. Acute findings include:  Displaced angulated L wrist  fracture Displace Olecranon fracuture Negative head CT  Case discussed with PA Jeffery and Dr. Alger Memos tong on L and post long arm splin on right. Initial plan for admission due to BL arm trauma and concern for inablity to perform ADLs. After extensive wait time for lab draw- patient opted to go home  Dr. Frazier Butt informed- can f/u in clinic tomorrow.  NVI pt discharge   Due to ? Puncture wound on L wrist with fracture, will tx with keflex. TDAP updated. PDMP reviewed during this encounter. Discharge with strict return precautions   Amount and/or Complexity of Data Reviewed Radiology: ordered. Decision-making details documented in ED Course.  Risk Prescription drug management.           Final Clinical Impression(s) / ED Diagnoses Final diagnoses:  None  Rx / DC Orders ED Discharge Orders     None         Arthor Captain, PA-C 02/09/23 1026    Bethann Berkshire, MD 02/11/23 1243

## 2023-02-08 NOTE — ED Triage Notes (Signed)
The pt was bib EMS. The pts neighbor was walking in the park with her and pt fell. No LOC, the pt stumbled and fell. Swelling and deformity observed to left wrist, pulses positive. Splint board placed to wrist and wrapped to keep aligned. An abrasion with a blood clot is observed to lower wrist. No bleeding observed. Placed 4x4 to area. Swelling observed to right elbow but has full ROM. Laceration to lower lip that has clotted. VS B/P 112/70, P 68, R 20, O2Sat (8%RA. A&Ox4. Not on any blood thinners, PMHx of Breast CA to right breast.

## 2023-02-08 NOTE — ED Notes (Signed)
Pt refused Tdap. She states last time she took it she became really sick and arm was sore. Abby PA-C informed.

## 2023-02-08 NOTE — Progress Notes (Signed)
Orthopedic Tech Progress Note Patient Details:  Sherry Rice [birth year] [id number]  Ortho Devices Type of Ortho Device: Ace wrap, Cotton web roll, Shoulder immobilizer, Post (long arm) splint, Sugartong splint Ortho Device/Splint Location: right long arm posterior splint. left sugartong. left sling applied Ortho Device/Splint Interventions: Ordered, Application, Adjustment   Post Interventions Patient Tolerated: Well Instructions Provided: Adjustment of device, Care of device  [name] 02/08/2023, 3:28 PM

## 2023-02-17 ENCOUNTER — Other Ambulatory Visit: Payer: Self-pay | Admitting: Acute Care

## 2023-02-17 DIAGNOSIS — Z122 Encounter for screening for malignant neoplasm of respiratory organs: Secondary | ICD-10-CM

## 2023-02-17 DIAGNOSIS — Z87891 Personal history of nicotine dependence: Secondary | ICD-10-CM

## 2023-05-31 ENCOUNTER — Inpatient Hospital Stay: Payer: Medicare Other

## 2023-05-31 ENCOUNTER — Inpatient Hospital Stay: Payer: Medicare Other | Admitting: Hematology and Oncology

## 2023-07-06 ENCOUNTER — Encounter: Payer: Self-pay | Admitting: Hematology and Oncology

## 2023-07-25 ENCOUNTER — Other Ambulatory Visit: Payer: Self-pay | Admitting: *Deleted

## 2023-07-25 DIAGNOSIS — Z170 Estrogen receptor positive status [ER+]: Secondary | ICD-10-CM

## 2023-07-26 ENCOUNTER — Inpatient Hospital Stay: Payer: Medicare Other

## 2023-07-26 ENCOUNTER — Inpatient Hospital Stay: Payer: Medicare Other | Attending: Hematology and Oncology

## 2023-07-26 VITALS — BP 124/76 | HR 66 | Temp 98.2°F | Resp 16

## 2023-07-26 DIAGNOSIS — M810 Age-related osteoporosis without current pathological fracture: Secondary | ICD-10-CM | POA: Diagnosis present

## 2023-07-26 DIAGNOSIS — Z853 Personal history of malignant neoplasm of breast: Secondary | ICD-10-CM | POA: Insufficient documentation

## 2023-07-26 DIAGNOSIS — Z170 Estrogen receptor positive status [ER+]: Secondary | ICD-10-CM

## 2023-07-26 LAB — CBC WITH DIFFERENTIAL (CANCER CENTER ONLY)
Abs Immature Granulocytes: 0.01 10*3/uL (ref 0.00–0.07)
Basophils Absolute: 0 10*3/uL (ref 0.0–0.1)
Basophils Relative: 1 %
Eosinophils Absolute: 0.1 10*3/uL (ref 0.0–0.5)
Eosinophils Relative: 2 %
HCT: 41.3 % (ref 36.0–46.0)
Hemoglobin: 14.3 g/dL (ref 12.0–15.0)
Immature Granulocytes: 0 %
Lymphocytes Relative: 41 %
Lymphs Abs: 2.4 10*3/uL (ref 0.7–4.0)
MCH: 33.6 pg (ref 26.0–34.0)
MCHC: 34.6 g/dL (ref 30.0–36.0)
MCV: 96.9 fL (ref 80.0–100.0)
Monocytes Absolute: 0.7 10*3/uL (ref 0.1–1.0)
Monocytes Relative: 11 %
Neutro Abs: 2.6 10*3/uL (ref 1.7–7.7)
Neutrophils Relative %: 45 %
Platelet Count: 241 10*3/uL (ref 150–400)
RBC: 4.26 MIL/uL (ref 3.87–5.11)
RDW: 12.6 % (ref 11.5–15.5)
WBC Count: 5.8 10*3/uL (ref 4.0–10.5)
nRBC: 0 % (ref 0.0–0.2)

## 2023-07-26 LAB — CMP (CANCER CENTER ONLY)
ALT: 16 U/L (ref 0–44)
AST: 21 U/L (ref 15–41)
Albumin: 4.6 g/dL (ref 3.5–5.0)
Alkaline Phosphatase: 70 U/L (ref 38–126)
Anion gap: 6 (ref 5–15)
BUN: 17 mg/dL (ref 8–23)
CO2: 31 mmol/L (ref 22–32)
Calcium: 9.9 mg/dL (ref 8.9–10.3)
Chloride: 100 mmol/L (ref 98–111)
Creatinine: 0.63 mg/dL (ref 0.44–1.00)
GFR, Estimated: >60 mL/min (ref >60)
Glucose, Bld: 94 mg/dL (ref 70–99)
Potassium: 4.3 mmol/L (ref 3.5–5.1)
Sodium: 137 mmol/L (ref 135–145)
Total Bilirubin: 0.7 mg/dL (ref 0.0–1.2)
Total Protein: 7.2 g/dL (ref 6.5–8.1)

## 2023-07-26 MED ORDER — DENOSUMAB 60 MG/ML ~~LOC~~ SOSY
60.0000 mg | PREFILLED_SYRINGE | Freq: Once | SUBCUTANEOUS | Status: AC
  Administered 2023-07-26: 60 mg via SUBCUTANEOUS
  Filled 2023-07-26: qty 1

## 2024-01-24 ENCOUNTER — Other Ambulatory Visit: Payer: Medicare Other

## 2024-01-24 ENCOUNTER — Ambulatory Visit: Payer: Medicare Other | Admitting: Hematology and Oncology

## 2024-01-24 ENCOUNTER — Ambulatory Visit: Payer: Medicare Other

## 2024-01-25 ENCOUNTER — Inpatient Hospital Stay: Admitting: Hematology and Oncology

## 2024-01-25 ENCOUNTER — Inpatient Hospital Stay

## 2024-02-07 ENCOUNTER — Other Ambulatory Visit: Payer: Self-pay | Admitting: *Deleted

## 2024-02-07 DIAGNOSIS — C50412 Malignant neoplasm of upper-outer quadrant of left female breast: Secondary | ICD-10-CM

## 2024-02-08 ENCOUNTER — Inpatient Hospital Stay (HOSPITAL_BASED_OUTPATIENT_CLINIC_OR_DEPARTMENT_OTHER): Admitting: Hematology and Oncology

## 2024-02-08 ENCOUNTER — Other Ambulatory Visit: Payer: Self-pay | Admitting: Acute Care

## 2024-02-08 ENCOUNTER — Inpatient Hospital Stay

## 2024-02-08 ENCOUNTER — Inpatient Hospital Stay: Attending: Hematology and Oncology

## 2024-02-08 VITALS — BP 127/86 | HR 76 | Temp 97.7°F | Resp 18 | Wt 124.3 lb

## 2024-02-08 DIAGNOSIS — Z853 Personal history of malignant neoplasm of breast: Secondary | ICD-10-CM | POA: Insufficient documentation

## 2024-02-08 DIAGNOSIS — Z170 Estrogen receptor positive status [ER+]: Secondary | ICD-10-CM

## 2024-02-08 DIAGNOSIS — Z780 Asymptomatic menopausal state: Secondary | ICD-10-CM | POA: Diagnosis not present

## 2024-02-08 DIAGNOSIS — M810 Age-related osteoporosis without current pathological fracture: Secondary | ICD-10-CM | POA: Insufficient documentation

## 2024-02-08 DIAGNOSIS — C50412 Malignant neoplasm of upper-outer quadrant of left female breast: Secondary | ICD-10-CM

## 2024-02-08 DIAGNOSIS — Z122 Encounter for screening for malignant neoplasm of respiratory organs: Secondary | ICD-10-CM

## 2024-02-08 DIAGNOSIS — Z87891 Personal history of nicotine dependence: Secondary | ICD-10-CM

## 2024-02-08 LAB — CMP (CANCER CENTER ONLY)
ALT: 24 U/L (ref 0–44)
AST: 24 U/L (ref 15–41)
Albumin: 4.7 g/dL (ref 3.5–5.0)
Alkaline Phosphatase: 58 U/L (ref 38–126)
Anion gap: 6 (ref 5–15)
BUN: 25 mg/dL — ABNORMAL HIGH (ref 8–23)
CO2: 30 mmol/L (ref 22–32)
Calcium: 10.3 mg/dL (ref 8.9–10.3)
Chloride: 103 mmol/L (ref 98–111)
Creatinine: 0.6 mg/dL (ref 0.44–1.00)
GFR, Estimated: >60 mL/min (ref >60)
Glucose, Bld: 98 mg/dL (ref 70–99)
Potassium: 5.3 mmol/L — ABNORMAL HIGH (ref 3.5–5.1)
Sodium: 139 mmol/L (ref 135–145)
Total Bilirubin: 0.9 mg/dL (ref 0.0–1.2)
Total Protein: 7.5 g/dL (ref 6.5–8.1)

## 2024-02-08 LAB — CBC WITH DIFFERENTIAL (CANCER CENTER ONLY)
Abs Immature Granulocytes: 0.03 K/uL (ref 0.00–0.07)
Basophils Absolute: 0 K/uL (ref 0.0–0.1)
Basophils Relative: 0 %
Eosinophils Absolute: 0.5 K/uL (ref 0.0–0.5)
Eosinophils Relative: 7 %
HCT: 40.1 % (ref 36.0–46.0)
Hemoglobin: 14.1 g/dL (ref 12.0–15.0)
Immature Granulocytes: 0 %
Lymphocytes Relative: 25 %
Lymphs Abs: 1.8 K/uL (ref 0.7–4.0)
MCH: 33.7 pg (ref 26.0–34.0)
MCHC: 35.2 g/dL (ref 30.0–36.0)
MCV: 95.7 fL (ref 80.0–100.0)
Monocytes Absolute: 0.7 K/uL (ref 0.1–1.0)
Monocytes Relative: 10 %
Neutro Abs: 4.2 K/uL (ref 1.7–7.7)
Neutrophils Relative %: 58 %
Platelet Count: 206 K/uL (ref 150–400)
RBC: 4.19 MIL/uL (ref 3.87–5.11)
RDW: 13.2 % (ref 11.5–15.5)
WBC Count: 7.2 K/uL (ref 4.0–10.5)
nRBC: 0 % (ref 0.0–0.2)

## 2024-02-08 MED ORDER — DENOSUMAB 60 MG/ML ~~LOC~~ SOSY
60.0000 mg | PREFILLED_SYRINGE | Freq: Once | SUBCUTANEOUS | Status: AC
  Administered 2024-02-08: 60 mg via SUBCUTANEOUS
  Filled 2024-02-08: qty 1

## 2024-02-08 NOTE — Assessment & Plan Note (Signed)
 11/10/2015: Left breast biopsy 2:00, 1.5 x 0.7 x 0.7 cm : Grade 1, IDC with ADH ER 100%, PR 0%, Ki-67 10%, HER-2 neg ratio 1.28; 12:00: 2.8 cm Inv mamm ca grade 1-2,  ER 90%, PR 30%, Ki-67 10%, HER-2 neg ratio 1.15; total size 4.8 cm, T2 N0 stage II a clinical stage   12/18/2015: Simple mastectomy: Invasive tubulolobular carcinoma, grade 1, 3.6 cm, low-grade DCIS, LCIS, ER 100% and 90%; PR 0% and 30%, HER-2 negative, Ki-67 10%, T2 NX stage II a    Oncotype DX score 26, 17% risk of recurrence with tamoxifen alone (intermediate risk) CT abdomen pelvis 02/26/2016: No evidence of metastatic disease    Treatment plan:  1. Adjuvant systemic chemotherapy: Patient refused 2. Adjuvant antiestrogen therapy with anastrozole  (started September 2017- Dec 2023) --------------------------------------------------------------------------------------------------------------------------------------------------------------- Anastrozole  discontinued December 2023   Surveillance: 1. Right Mammogram 07/04/2023 at [facility]: Benign  Breast density category C 2.  Bone density 05/09/2022: T-score -2.7: Osteoporosis: Recommended bisphosphonates along with calcium and vitamin D. Setting her up Prolia  injections She will come every 6 months for Prolia  injections with labs. Next bone density scan in January 2026 to assess need for continued Prolia .   Return to clinic in 1 year for follow-up

## 2024-02-08 NOTE — Progress Notes (Signed)
 Patient Care Team: Dyane Anthony RAMAN, FNP as PCP - General (Family Medicine) Celestia Agent, MD (Inactive) (Gastroenterology) Robinson Pao, MD (Dermatology) Crawford Morna Pickle, NP as Nurse Practitioner (Hematology and Oncology) Gail Favorite, MD as Consulting Physician (General Surgery) Odean Potts, MD as Consulting Physician (Hematology and Oncology)  DIAGNOSIS:  Encounter Diagnoses  Name Primary?   Malignant neoplasm of upper-outer quadrant of left breast in female, estrogen receptor positive (HCC) Yes   Post-menopausal     SUMMARY OF ONCOLOGIC HISTORY: Oncology History  Breast cancer of upper-outer quadrant of left female breast (HCC)  11/10/2015 Initial Diagnosis   Left breast biopsy 2:00, 1.5 x 0.7 x 0.7 cm : Grade 1, IDC with ADH ER 100%, PR 0%, Ki-67 10%, HER-2 neg ratio 1.28; 12:00: 2.8 cm Inv mamm ca grade 1-2,  ER 90%, PR 30%, Ki-67 10%, HER-2 neg ratio 1.15; total size 4.8 cm, T2 N0 stage II a clinical stage   11/26/2015 Genetic Testing   Genetic testing was normal, and did not reveal a deleterious mutation in these genes.  Additionally, no variants of uncertain significance (VUSes) were found. Genes tested include: ATM, BARD1, BRCA1, BRCA2, BRIP1, CDH1, CHEK2, FANCC, MLH1, MSH2, MSH6, NBN, PALB2, PMS2, PTEN, RAD51C, RAD51D, TP53, and XRCC2.  This panel also includes deletion/duplication analysis (without sequencing) for one gene, EPCAM.    12/18/2015 Surgery   Simple mastectomy [facility]): Invasive tubulolobular carcinoma, grade 1, 3.6 cm, low-grade DCIS, LCIS, ER 100% and 90%; PR 0% and 30%, HER-2 negative, Ki-67 10%, T2 NX stage II a    12/29/2015 Oncotype testing   Oncotype score 26, 17% ROR (intermediate)   01/2016 -  Anti-estrogen oral therapy   Anastrozole  1mg  daily   02/22/2016 Imaging   CT abdomen pelvis to evaluate pancreatic lesion: Less conspicuous uncinate process of pancreas lesion measuring 8 x 4 mm previously (11 x 8 mm)     CHIEF COMPLIANT:  Surveillance of breast cancer on Prolia  for osteoporosis  HISTORY OF PRESENT ILLNESS:  History of Present Illness Sherry Rice is a 78 year old female who presents for follow-up after a fall and to discuss bone health management.  Approximately one year ago, she experienced a fall resulting in fractures that required surgical intervention with pins and plates. She is currently on Prolia  for bone health management.  She has resumed walking and is able to walk three and a half miles independently. She recently moved to a house with a yard, allowing her to exercise her dog without needing to walk long distances.  Her last mammogram in March at [facility] showed moderate breast density (category C). She has been receiving 3D mammograms to monitor her breast health.     ALLERGIES:  is allergic to codeine.  MEDICATIONS:  Current Outpatient Medications  Medication Sig Dispense Refill   Calcium-Magnesium-Vitamin D (CALCIUM 1200+D3 PO)      cephALEXin  (KEFLEX ) 500 MG capsule 2 caps po bid x 7 days 28 capsule 0   cholecalciferol (VITAMIN D) 1000 units tablet Take 1,000 Units by mouth daily.     Coenzyme Q10 (CO Q-10) 200 MG CAPS Take 1 tablet by mouth daily.  0   losartan -hydrochlorothiazide  (HYZAAR ) 50-12.5 MG tablet Take 1 tablet by mouth daily.     Na Sulfate-K Sulfate-Mg Sulf 17.5-3.13-1.6 GM/177ML SOLN MIX AND DRINK AS DIRECTED     naproxen  (NAPROSYN ) 375 MG tablet Take 1 tablet (375 mg total) by mouth 2 (two) times daily with a meal. 20 tablet 0   oxyCODONE  (  ROXICODONE ) 5 MG immediate release tablet Take 1-2 tablets (5-10 mg total) by mouth every 6 (six) hours as needed for severe pain. 20 tablet 0   pyridOXINE (VITAMIN B-6) 50 MG tablet Take 1 tablet (50 mg total) daily by mouth.     tretinoin (RETIN-A) 0.1 % cream 1 application in the evening to face Externally Once a day     Turmeric 1053 MG TABS Take 1,000 mg by mouth daily.     No current facility-administered medications for this  visit.   Facility-Administered Medications Ordered in Other Visits  Medication Dose Route Frequency Provider Last Rate Last Admin   influenza vaccine adjuvanted (FLUAD) injection 0.5 mL  0.5 mL Intramuscular Once Odean Potts, MD        PHYSICAL EXAMINATION: ECOG PERFORMANCE STATUS: 1 - Symptomatic but completely ambulatory  Vitals:   02/08/24 1135  BP: 127/86  Pulse: 76  Resp: 18  Temp: 97.7 F (36.5 C)  SpO2: 98%   Filed Weights   02/08/24 1135  Weight: 124 lb 5 oz (56.4 kg)    Physical Exam No palpable lumps nodules in bilateral breasts or axilla  (exam performed in the presence of a chaperone)  LABORATORY DATA:  I have reviewed the data as listed    Latest Ref Rng & Units 07/26/2023    2:08 PM 01/26/2023    2:25 PM 07/25/2022    1:56 PM  CMP  Glucose 70 - 99 mg/dL 94  899  887   BUN 8 - 23 mg/dL 17  21  19    Creatinine 0.44 - 1.00 mg/dL 9.36  9.39  9.39   Sodium 135 - 145 mmol/L 137  139  138   Potassium 3.5 - 5.1 mmol/L 4.3  4.5  4.2   Chloride 98 - 111 mmol/L 100  103  102   CO2 22 - 32 mmol/L 31  31  32   Calcium 8.9 - 10.3 mg/dL 9.9  9.6  9.7   Total Protein 6.5 - 8.1 g/dL 7.2  6.8  6.6   Total Bilirubin 0.0 - 1.2 mg/dL 0.7  0.7  0.7   Alkaline Phos 38 - 126 U/L 70  65  78   AST 15 - 41 U/L 21  22  24    ALT 0 - 44 U/L 16  20  22      Lab Results  Component Value Date   WBC 7.2 02/08/2024   HGB 14.1 02/08/2024   HCT 40.1 02/08/2024   MCV 95.7 02/08/2024   PLT 206 02/08/2024   NEUTROABS 4.2 02/08/2024    ASSESSMENT & PLAN:  Breast cancer of upper-outer quadrant of left female breast (HCC) 11/10/2015: Left breast biopsy 2:00, 1.5 x 0.7 x 0.7 cm : Grade 1, IDC with ADH ER 100%, PR 0%, Ki-67 10%, HER-2 neg ratio 1.28; 12:00: 2.8 cm Inv mamm ca grade 1-2,  ER 90%, PR 30%, Ki-67 10%, HER-2 neg ratio 1.15; total size 4.8 cm, T2 N0 stage II a clinical stage   12/18/2015: Simple mastectomy: Invasive tubulolobular carcinoma, grade 1, 3.6 cm, low-grade DCIS,  LCIS, ER 100% and 90%; PR 0% and 30%, HER-2 negative, Ki-67 10%, T2 NX stage II a    Oncotype DX score 26, 17% risk of recurrence with tamoxifen alone (intermediate risk) CT abdomen pelvis 02/26/2016: No evidence of metastatic disease    Treatment plan:  1. Adjuvant systemic chemotherapy: Patient refused 2. Adjuvant antiestrogen therapy with anastrozole  (started September 2017- Dec 2023) --------------------------------------------------------------------------------------------------------------------------------------------------------------- Anastrozole  discontinued  December 2023   Surveillance: 1. Right Mammogram 07/04/2023 at [facility]: Benign  Breast density category C 2.  Bone density 05/09/2022: T-score -2.7: Osteoporosis: Recommended bisphosphonates along with calcium and vitamin D. Setting her up Prolia  injections She will come every 6 months for Prolia  injections with labs. Next bone density scan in January 2026 to assess need for continued Prolia .   Return to clinic in 1 year for follow-up ------------------------------------- Assessment and Plan Assessment & Plan Estrogen receptor positive malignant neoplasm of upper-outer quadrant of left breast Recent mammograms normal. Breast density category C. 3D mammograms used for monitoring. - Perform breast exam today. - Schedule next mammogram at [facility] [address].  Age-related osteoporosis On Prolia  for management. - Schedule bone density scan in January.  Fall with psychological impact Experienced significant psychological impact from fall. Resumed cautious walking after physical therapy. Recent move facilitates walking.      Orders Placed This Encounter  Procedures   DG Bone Density    Standing Status:   Future    Expected Date:   05/10/2024    Expiration Date:   02/07/2025    Reason for Exam (SYMPTOM  OR DIAGNOSIS REQUIRED):   Post menopausal    Preferred imaging location?:   [facility]    Release to  patient:   Immediate   The patient has a good understanding of the overall plan. she agrees with it. she will call with any problems that may develop before the next visit here.  I personally spent a total of 30 minutes in the care of the patient today including preparing to see the patient, getting/reviewing separately obtained history, performing a medically appropriate exam/evaluation, counseling and educating, placing orders, referring and communicating with other health care professionals, documenting clinical information in the EHR, independently interpreting results, communicating results, and coordinating care.   Viinay K Vonnetta Akey, MD 02/08/24

## 2024-02-19 ENCOUNTER — Ambulatory Visit (HOSPITAL_COMMUNITY)
Admission: RE | Admit: 2024-02-19 | Discharge: 2024-02-19 | Disposition: A | Discharge status: Home / Self Care | Source: Ambulatory Visit | Attending: Family Medicine | Admitting: Family Medicine

## 2024-02-19 DIAGNOSIS — Z87891 Personal history of nicotine dependence: Secondary | ICD-10-CM | POA: Diagnosis present

## 2024-02-19 DIAGNOSIS — Z122 Encounter for screening for malignant neoplasm of respiratory organs: Secondary | ICD-10-CM | POA: Insufficient documentation

## 2024-02-22 ENCOUNTER — Other Ambulatory Visit: Payer: Self-pay

## 2024-02-22 DIAGNOSIS — Z87891 Personal history of nicotine dependence: Secondary | ICD-10-CM

## 2024-02-22 DIAGNOSIS — Z122 Encounter for screening for malignant neoplasm of respiratory organs: Secondary | ICD-10-CM

## 2024-07-29 ENCOUNTER — Other Ambulatory Visit (HOSPITAL_BASED_OUTPATIENT_CLINIC_OR_DEPARTMENT_OTHER)

## 2024-08-08 ENCOUNTER — Ambulatory Visit

## 2024-08-08 ENCOUNTER — Other Ambulatory Visit

## 2025-02-10 ENCOUNTER — Other Ambulatory Visit

## 2025-02-10 ENCOUNTER — Ambulatory Visit: Admitting: Hematology and Oncology

## 2025-02-10 ENCOUNTER — Ambulatory Visit
